# Patient Record
Sex: Female | Born: 1935 | Race: White | Hispanic: No | Marital: Married | Smoking: Former smoker
Health system: Southern US, Community
[De-identification: ages and names within clinical notes are randomized; demographics above are authoritative.]

## PROBLEM LIST (undated history)

## (undated) DIAGNOSIS — M1990 Unspecified osteoarthritis, unspecified site: Secondary | ICD-10-CM

## (undated) DIAGNOSIS — F419 Anxiety disorder, unspecified: Secondary | ICD-10-CM

## (undated) DIAGNOSIS — E079 Disorder of thyroid, unspecified: Secondary | ICD-10-CM

## (undated) DIAGNOSIS — E785 Hyperlipidemia, unspecified: Secondary | ICD-10-CM

## (undated) DIAGNOSIS — M8580 Other specified disorders of bone density and structure, unspecified site: Secondary | ICD-10-CM

## (undated) DIAGNOSIS — R51 Headache: Secondary | ICD-10-CM

## (undated) DIAGNOSIS — B029 Zoster without complications: Secondary | ICD-10-CM

## (undated) DIAGNOSIS — K219 Gastro-esophageal reflux disease without esophagitis: Secondary | ICD-10-CM

## (undated) HISTORY — DX: Headache: R51

## (undated) HISTORY — PX: COLONOSCOPY: SHX174

## (undated) HISTORY — DX: Disorder of thyroid, unspecified: E07.9

## (undated) HISTORY — DX: Gastro-esophageal reflux disease without esophagitis: K21.9

## (undated) HISTORY — DX: Hyperlipidemia, unspecified: E78.5

## (undated) HISTORY — DX: Anxiety disorder, unspecified: F41.9

## (undated) HISTORY — DX: Unspecified osteoarthritis, unspecified site: M19.90

## (undated) HISTORY — PX: BREAST SURGERY: SHX581

## (undated) HISTORY — DX: Zoster without complications: B02.9

## (undated) HISTORY — DX: Other specified disorders of bone density and structure, unspecified site: M85.80

---

## 1998-03-01 ENCOUNTER — Other Ambulatory Visit: Admission: RE | Admit: 1998-03-01 | Discharge: 1998-03-01 | Payer: Self-pay | Admitting: Family Medicine

## 1999-03-10 ENCOUNTER — Other Ambulatory Visit: Admission: RE | Admit: 1999-03-10 | Discharge: 1999-03-10 | Payer: Self-pay | Admitting: Family Medicine

## 1999-12-25 ENCOUNTER — Encounter: Admission: RE | Admit: 1999-12-25 | Discharge: 1999-12-25 | Payer: Self-pay | Admitting: Family Medicine

## 1999-12-25 ENCOUNTER — Encounter: Payer: Self-pay | Admitting: Family Medicine

## 2001-02-10 ENCOUNTER — Encounter: Admission: RE | Admit: 2001-02-10 | Discharge: 2001-02-10 | Payer: Self-pay | Admitting: Family Medicine

## 2001-02-10 ENCOUNTER — Encounter: Payer: Self-pay | Admitting: Family Medicine

## 2002-03-16 ENCOUNTER — Encounter: Payer: Self-pay | Admitting: Family Medicine

## 2002-03-16 ENCOUNTER — Encounter: Admission: RE | Admit: 2002-03-16 | Discharge: 2002-03-16 | Payer: Self-pay | Admitting: Family Medicine

## 2002-03-17 ENCOUNTER — Encounter: Admission: RE | Admit: 2002-03-17 | Discharge: 2002-03-17 | Payer: Self-pay | Admitting: Family Medicine

## 2002-03-17 ENCOUNTER — Encounter: Payer: Self-pay | Admitting: Family Medicine

## 2003-03-24 ENCOUNTER — Encounter: Payer: Self-pay | Admitting: Family Medicine

## 2003-03-24 ENCOUNTER — Encounter: Admission: RE | Admit: 2003-03-24 | Discharge: 2003-03-24 | Payer: Self-pay | Admitting: Family Medicine

## 2003-03-30 ENCOUNTER — Other Ambulatory Visit: Admission: RE | Admit: 2003-03-30 | Discharge: 2003-03-30 | Payer: Self-pay | Admitting: Family Medicine

## 2004-03-23 ENCOUNTER — Encounter: Admission: RE | Admit: 2004-03-23 | Discharge: 2004-03-23 | Payer: Self-pay | Admitting: Family Medicine

## 2004-05-18 ENCOUNTER — Ambulatory Visit: Payer: Self-pay | Admitting: Family Medicine

## 2004-09-19 ENCOUNTER — Ambulatory Visit: Payer: Self-pay | Admitting: Family Medicine

## 2004-12-06 ENCOUNTER — Ambulatory Visit: Payer: Self-pay | Admitting: Family Medicine

## 2005-03-27 ENCOUNTER — Encounter: Admission: RE | Admit: 2005-03-27 | Discharge: 2005-03-27 | Payer: Self-pay | Admitting: Family Medicine

## 2005-04-05 ENCOUNTER — Ambulatory Visit: Payer: Self-pay | Admitting: Family Medicine

## 2005-05-19 ENCOUNTER — Ambulatory Visit: Payer: Self-pay | Admitting: Family Medicine

## 2005-10-31 ENCOUNTER — Ambulatory Visit: Payer: Self-pay | Admitting: Family Medicine

## 2005-11-02 ENCOUNTER — Ambulatory Visit: Payer: Self-pay | Admitting: Family Medicine

## 2006-04-01 ENCOUNTER — Encounter: Admission: RE | Admit: 2006-04-01 | Discharge: 2006-04-01 | Payer: Self-pay | Admitting: Family Medicine

## 2006-04-18 ENCOUNTER — Other Ambulatory Visit: Admission: RE | Admit: 2006-04-18 | Discharge: 2006-04-18 | Payer: Self-pay | Admitting: Family Medicine

## 2006-04-18 ENCOUNTER — Ambulatory Visit: Payer: Self-pay | Admitting: Family Medicine

## 2006-04-18 ENCOUNTER — Encounter: Payer: Self-pay | Admitting: Family Medicine

## 2006-04-18 LAB — CONVERTED CEMR LAB
ALT: 24 units/L (ref 0–40)
AST: 26 units/L (ref 0–37)
Basophils Absolute: 0 10*3/uL (ref 0.0–0.1)
Basophils Relative: 0.6 % (ref 0.0–1.0)
Calcium: 10.3 mg/dL (ref 8.4–10.5)
Chol/HDL Ratio, serum: 4.3
Cholesterol: 189 mg/dL (ref 0–200)
Creatinine, Ser: 1.2 mg/dL (ref 0.4–1.2)
Eosinophil percent: 2 % (ref 0.0–5.0)
Glucose, Bld: 100 mg/dL — ABNORMAL HIGH (ref 70–99)
HDL: 44.1 mg/dL (ref >39.0)
Hemoglobin: 14.6 g/dL (ref 12.0–15.0)
LDL Cholesterol: 122 mg/dL — ABNORMAL HIGH (ref 0–99)
Lymphocytes Relative: 23.5 % (ref 12.0–46.0)
Monocytes Relative: 7.1 % (ref 3.0–11.0)
Neutrophils Relative %: 66.8 % (ref 43.0–77.0)
TSH: 0.9 microintl units/mL (ref 0.35–5.50)
Triglyceride fasting, serum: 112 mg/dL (ref 0–149)
VLDL: 22 mg/dL (ref 0–40)

## 2006-04-22 LAB — CONVERTED CEMR LAB: Pap Smear: NEGATIVE

## 2006-08-06 ENCOUNTER — Ambulatory Visit: Payer: Self-pay | Admitting: Family Medicine

## 2006-08-07 ENCOUNTER — Encounter: Payer: Self-pay | Admitting: Family Medicine

## 2006-08-22 ENCOUNTER — Ambulatory Visit: Payer: Self-pay | Admitting: Family Medicine

## 2007-03-03 DIAGNOSIS — E785 Hyperlipidemia, unspecified: Secondary | ICD-10-CM

## 2007-03-03 DIAGNOSIS — R51 Headache: Secondary | ICD-10-CM

## 2007-03-03 DIAGNOSIS — R519 Headache, unspecified: Secondary | ICD-10-CM | POA: Insufficient documentation

## 2007-04-17 ENCOUNTER — Encounter: Admission: RE | Admit: 2007-04-17 | Discharge: 2007-04-17 | Payer: Self-pay | Admitting: Family Medicine

## 2007-04-18 ENCOUNTER — Ambulatory Visit: Payer: Self-pay | Admitting: Family Medicine

## 2007-06-03 ENCOUNTER — Ambulatory Visit: Payer: Self-pay | Admitting: Family Medicine

## 2007-06-03 DIAGNOSIS — T50995A Adverse effect of other drugs, medicaments and biological substances, initial encounter: Secondary | ICD-10-CM

## 2007-06-03 DIAGNOSIS — M899 Disorder of bone, unspecified: Secondary | ICD-10-CM | POA: Insufficient documentation

## 2007-06-03 DIAGNOSIS — M949 Disorder of cartilage, unspecified: Secondary | ICD-10-CM

## 2007-06-03 DIAGNOSIS — N390 Urinary tract infection, site not specified: Secondary | ICD-10-CM | POA: Insufficient documentation

## 2007-06-03 DIAGNOSIS — E039 Hypothyroidism, unspecified: Secondary | ICD-10-CM | POA: Insufficient documentation

## 2007-06-03 DIAGNOSIS — M1990 Unspecified osteoarthritis, unspecified site: Secondary | ICD-10-CM | POA: Insufficient documentation

## 2007-06-03 LAB — CONVERTED CEMR LAB
Glucose, Urine, Semiquant: NEGATIVE
Nitrite: NEGATIVE
Specific Gravity, Urine: <1.005
Urobilinogen, UA: 0.2

## 2007-06-16 LAB — CONVERTED CEMR LAB
ALT: 20 units/L (ref 0–35)
Albumin: 4.1 g/dL (ref 3.5–5.2)
Alkaline Phosphatase: 86 units/L (ref 39–117)
BUN: 7 mg/dL (ref 6–23)
Basophils Relative: 0.1 % (ref 0.0–1.0)
Bilirubin, Direct: 0.1 mg/dL (ref 0.0–0.3)
CO2: 30 meq/L (ref 19–32)
Calcium: 10.3 mg/dL (ref 8.4–10.5)
Cholesterol: 154 mg/dL (ref 0–200)
Creatinine, Ser: 1.1 mg/dL (ref 0.4–1.2)
Eosinophils Absolute: 0.1 10*3/uL (ref 0.0–0.6)
GFR calc non Af Amer: 52 mL/min
Glucose, Bld: 101 mg/dL — ABNORMAL HIGH (ref 70–99)
HCT: 43.6 % (ref 36.0–46.0)
HDL: 40.7 mg/dL (ref >39.0)
LDL Cholesterol: 83 mg/dL (ref 0–99)
Lymphocytes Relative: 17.1 % (ref 12.0–46.0)
MCV: 85.8 fL (ref 78.0–100.0)
Monocytes Absolute: 0.6 10*3/uL (ref 0.2–0.7)
Monocytes Relative: 7.1 % (ref 3.0–11.0)
Neutrophils Relative %: 74.7 % (ref 43.0–77.0)
Platelets: 328 10*3/uL (ref 150–400)
Potassium: 4.8 meq/L (ref 3.5–5.1)
RBC: 5.08 M/uL (ref 3.87–5.11)
RDW: 12.2 % (ref 11.5–14.6)
TSH: 0.31 microintl units/mL — ABNORMAL LOW (ref 0.35–5.50)
Total CHOL/HDL Ratio: 3.8
Total Protein: 6.8 g/dL (ref 6.0–8.3)
Triglycerides: 151 mg/dL — ABNORMAL HIGH (ref 0–149)
VLDL: 30 mg/dL (ref 0–40)

## 2007-09-11 ENCOUNTER — Ambulatory Visit: Payer: Self-pay | Admitting: Family Medicine

## 2007-09-11 DIAGNOSIS — J069 Acute upper respiratory infection, unspecified: Secondary | ICD-10-CM | POA: Insufficient documentation

## 2007-09-11 DIAGNOSIS — J209 Acute bronchitis, unspecified: Secondary | ICD-10-CM

## 2008-01-13 ENCOUNTER — Ambulatory Visit: Payer: Self-pay | Admitting: Family Medicine

## 2008-01-13 DIAGNOSIS — R5381 Other malaise: Secondary | ICD-10-CM

## 2008-01-13 DIAGNOSIS — K625 Hemorrhage of anus and rectum: Secondary | ICD-10-CM

## 2008-01-13 DIAGNOSIS — R5383 Other fatigue: Secondary | ICD-10-CM

## 2008-01-13 DIAGNOSIS — M545 Low back pain: Secondary | ICD-10-CM

## 2008-01-14 ENCOUNTER — Ambulatory Visit: Payer: Self-pay | Admitting: Family Medicine

## 2008-04-14 ENCOUNTER — Ambulatory Visit: Payer: Self-pay | Admitting: Family Medicine

## 2008-04-19 ENCOUNTER — Encounter: Admission: RE | Admit: 2008-04-19 | Discharge: 2008-04-19 | Payer: Self-pay | Admitting: Family Medicine

## 2008-04-28 ENCOUNTER — Encounter: Admission: RE | Admit: 2008-04-28 | Discharge: 2008-04-28 | Payer: Self-pay | Admitting: Family Medicine

## 2008-06-10 ENCOUNTER — Encounter: Payer: Self-pay | Admitting: Family Medicine

## 2008-06-10 ENCOUNTER — Ambulatory Visit: Payer: Self-pay | Admitting: Family Medicine

## 2008-06-10 ENCOUNTER — Other Ambulatory Visit: Admission: RE | Admit: 2008-06-10 | Discharge: 2008-06-10 | Payer: Self-pay | Admitting: Family Medicine

## 2008-06-10 DIAGNOSIS — F411 Generalized anxiety disorder: Secondary | ICD-10-CM | POA: Insufficient documentation

## 2008-06-10 DIAGNOSIS — J45909 Unspecified asthma, uncomplicated: Secondary | ICD-10-CM | POA: Insufficient documentation

## 2008-06-10 DIAGNOSIS — M722 Plantar fascial fibromatosis: Secondary | ICD-10-CM

## 2008-06-10 LAB — HM PAP SMEAR

## 2008-06-16 ENCOUNTER — Encounter: Payer: Self-pay | Admitting: Family Medicine

## 2008-07-09 HISTORY — PX: CATARACT EXTRACTION, BILATERAL: SHX1313

## 2008-12-29 ENCOUNTER — Ambulatory Visit: Payer: Self-pay | Admitting: Family Medicine

## 2008-12-29 ENCOUNTER — Telehealth: Payer: Self-pay | Admitting: Family Medicine

## 2008-12-29 DIAGNOSIS — R609 Edema, unspecified: Secondary | ICD-10-CM

## 2009-04-12 ENCOUNTER — Ambulatory Visit: Payer: Self-pay | Admitting: Family Medicine

## 2009-05-23 ENCOUNTER — Encounter: Admission: RE | Admit: 2009-05-23 | Discharge: 2009-05-23 | Payer: Self-pay | Admitting: Family Medicine

## 2009-06-14 IMAGING — CR DG KNEE 1-2V*R*
2 series · 2 of 2 positions shown · non-contrast
Comparison: None

CLINICAL DATA: Arthritis.  Right knee pain.

RIGHT KNEE - 1-2 VIEW

[view not recorded (1 of 2)]
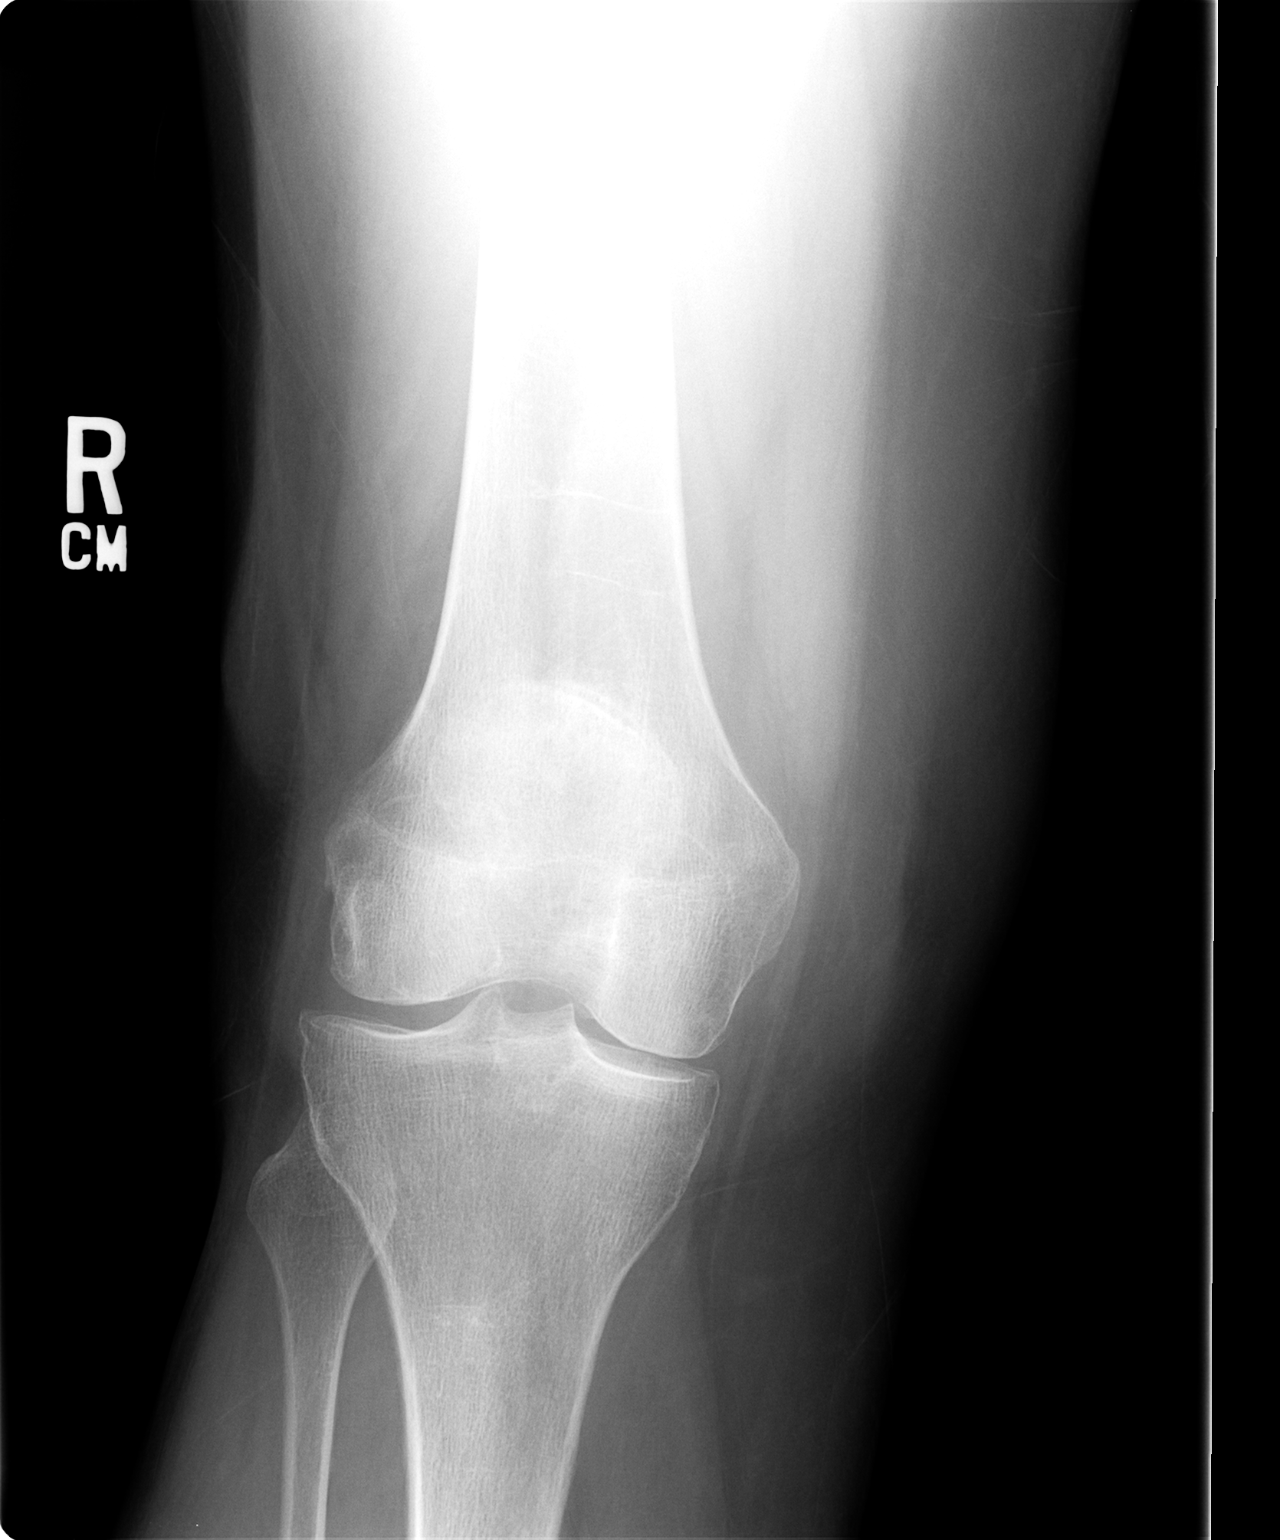

[view not recorded (2 of 2)]
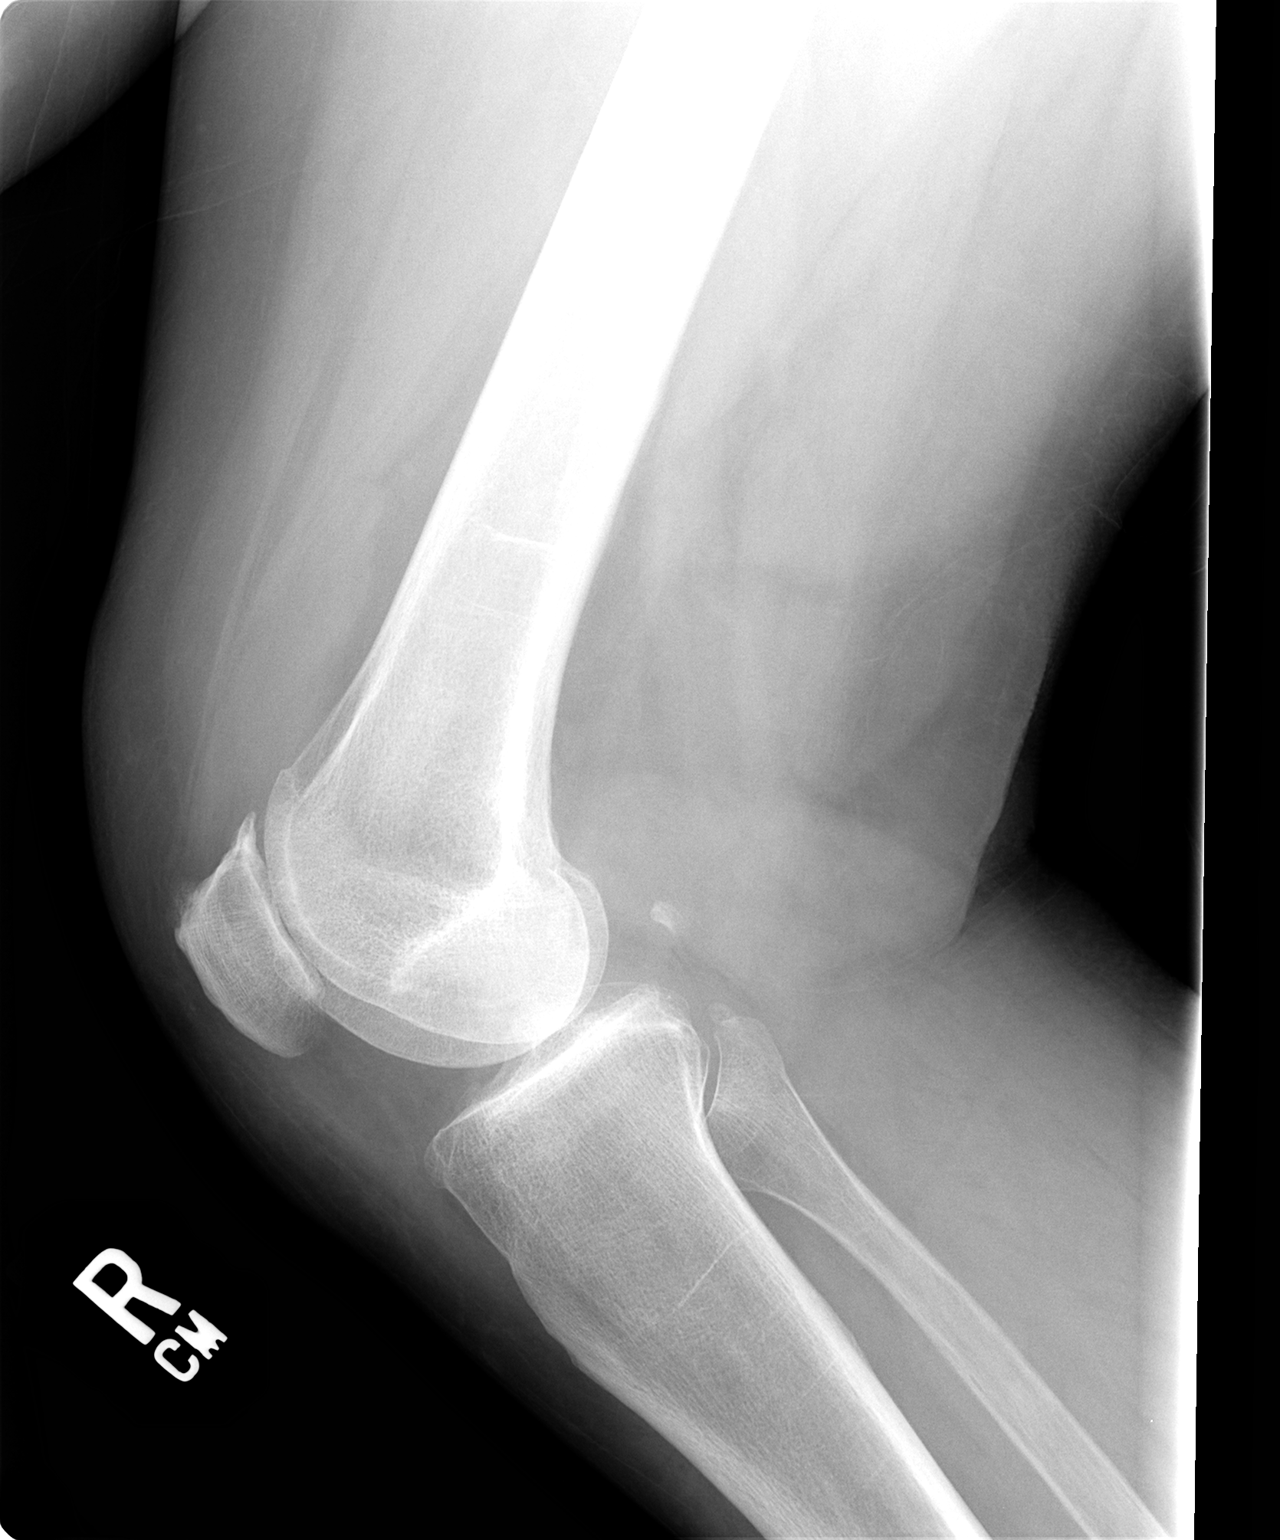

[2 of 2 positions shown; findings below may reference images not displayed]

FINDINGS: Narrowing of the patellofemoral joint space.  Osteophytic
lipping of the dorsal and superior aspect of the patella.
Femorotibial joint spaces are preserved.  Slight osteophytic
lipping of the lateral tibial plateau.  No lytic lesions or para-
articular erosions.  Knee joint effusion.  There appears to be a
large amount of fluid in the suprapatellar bursa.
IMPRESSION: Degenerative OA changes mainly involving the patellofemoral joint.
Joint effusion.

## 2009-06-16 ENCOUNTER — Ambulatory Visit: Payer: Self-pay | Admitting: Family Medicine

## 2010-03-31 ENCOUNTER — Encounter: Payer: Self-pay | Admitting: Family Medicine

## 2010-04-03 ENCOUNTER — Ambulatory Visit: Payer: Self-pay | Admitting: Family Medicine

## 2010-04-03 DIAGNOSIS — B351 Tinea unguium: Secondary | ICD-10-CM

## 2010-04-03 DIAGNOSIS — M25569 Pain in unspecified knee: Secondary | ICD-10-CM

## 2010-04-03 LAB — CONVERTED CEMR LAB
Basophils Absolute: 0.1 10*3/uL (ref 0.0–0.1)
CO2: 32 meq/L (ref 19–32)
Calcium: 9.9 mg/dL (ref 8.4–10.5)
Creatinine, Ser: 1.2 mg/dL (ref 0.4–1.2)
Eosinophils Absolute: 0.3 10*3/uL (ref 0.0–0.7)
Glucose, Bld: 98 mg/dL (ref 70–99)
Hemoglobin: 14.2 g/dL (ref 12.0–15.0)
Lymphocytes Relative: 29.6 % (ref 12.0–46.0)
Lymphs Abs: 2.9 10*3/uL (ref 0.7–4.0)
MCHC: 33.2 g/dL (ref 30.0–36.0)
Neutrophils Relative %: 57 % (ref 43.0–77.0)
Platelets: 320 10*3/uL (ref 150.0–400.0)
Potassium: 5 meq/L (ref 3.5–5.1)
RBC: 4.96 M/uL (ref 3.87–5.11)
TSH: 0.47 microintl units/mL (ref 0.35–5.50)
Uric Acid, Serum: 7.6 mg/dL — ABNORMAL HIGH (ref 2.4–7.0)
WBC: 9.7 10*3/uL (ref 4.5–10.5)

## 2010-06-15 ENCOUNTER — Encounter
Admission: RE | Admit: 2010-06-15 | Discharge: 2010-06-15 | Payer: Self-pay | Source: Home / Self Care | Admitting: Family Medicine

## 2010-07-30 ENCOUNTER — Encounter: Payer: Self-pay | Admitting: Family Medicine

## 2010-08-06 LAB — CONVERTED CEMR LAB
ALT: 23 units/L (ref 0–35)
ALT: 23 units/L (ref 0–35)
AST: 24 units/L (ref 0–37)
AST: 27 units/L (ref 0–37)
Alkaline Phosphatase: 78 units/L (ref 39–117)
Alkaline Phosphatase: 91 units/L (ref 39–117)
Blood in Urine, dipstick: NEGATIVE
Blood in Urine, dipstick: NEGATIVE
CO2: 26 meq/L (ref 19–32)
CO2: 32 meq/L (ref 19–32)
Calcium: 10 mg/dL (ref 8.4–10.5)
Calcium: 9.9 mg/dL (ref 8.4–10.5)
Chloride: 107 meq/L (ref 96–112)
Chloride: 108 meq/L (ref 96–112)
Cholesterol: 152 mg/dL (ref 0–200)
Eosinophils Absolute: 0.1 10*3/uL (ref 0.0–0.7)
GFR calc non Af Amer: 51.45 mL/min (ref >60)
GFR calc non Af Amer: 58 mL/min
Glucose, Bld: 106 mg/dL — ABNORMAL HIGH (ref 70–99)
Glucose, Urine, Semiquant: NEGATIVE
HCT: 44.1 % (ref 36.0–46.0)
HCT: 45 % (ref 36.0–46.0)
HDL: 36 mg/dL — ABNORMAL LOW (ref >39.00)
Hemoglobin: 14.7 g/dL (ref 12.0–15.0)
Hemoglobin: 15.3 g/dL — ABNORMAL HIGH (ref 12.0–15.0)
Ketones, urine, test strip: NEGATIVE
Lymphocytes Relative: 22.1 % (ref 12.0–46.0)
MCHC: 33.9 g/dL (ref 30.0–36.0)
Monocytes Absolute: 0.7 10*3/uL (ref 0.1–1.0)
Monocytes Relative: 5.8 % (ref 3.0–12.0)
Neutro Abs: 5.6 10*3/uL (ref 1.4–7.7)
Neutro Abs: 6.2 10*3/uL (ref 1.4–7.7)
Neutrophils Relative %: 67.9 % (ref 43.0–77.0)
Nitrite: NEGATIVE
Nitrite: NEGATIVE
Potassium: 3.9 meq/L (ref 3.5–5.1)
Potassium: 4.1 meq/L (ref 3.5–5.1)
RBC: 5.03 M/uL (ref 3.87–5.11)
RDW: 12.2 % (ref 11.5–14.6)
Sodium: 148 meq/L — ABNORMAL HIGH (ref 135–145)
Specific Gravity, Urine: <1.005
TSH: 0.37 microintl units/mL (ref 0.35–5.50)
Total Bilirubin: 0.7 mg/dL (ref 0.3–1.2)
Total CHOL/HDL Ratio: 4
Urobilinogen, UA: 0.2
WBC: 9.1 10*3/uL (ref 4.5–10.5)
pH: 5

## 2010-08-10 NOTE — Miscellaneous (Signed)
Summary: Controlled Substances Contract  Controlled Substances Contract   Imported By: Maryln Gottron 04/04/2010 15:36:36  _____________________________________________________________________  External Attachment:    Type:   Image     Comment:   External Document
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
## 2010-09-20 ENCOUNTER — Other Ambulatory Visit: Payer: Self-pay | Admitting: Family Medicine

## 2010-12-25 ENCOUNTER — Other Ambulatory Visit: Payer: Self-pay | Admitting: Family Medicine

## 2010-12-26 ENCOUNTER — Other Ambulatory Visit: Payer: Self-pay

## 2010-12-26 MED ORDER — LEVOTHYROXINE SODIUM 88 MCG PO TABS: 88.0000 ug | ORAL_TABLET | Freq: Every day | ORAL | Status: DC

## 2010-12-26 NOTE — Telephone Encounter (Signed)
rx sent into pharmacy

## 2011-01-30 ENCOUNTER — Encounter: Payer: Self-pay | Admitting: Family Medicine

## 2011-01-31 ENCOUNTER — Ambulatory Visit (INDEPENDENT_AMBULATORY_CARE_PROVIDER_SITE_OTHER): Payer: Medicare Other | Admitting: Family Medicine

## 2011-01-31 ENCOUNTER — Encounter: Payer: Self-pay | Admitting: Family Medicine

## 2011-01-31 VITALS — BP 110/80 | HR 91 | Temp 98.0°F | Wt 188.0 lb

## 2011-01-31 DIAGNOSIS — E785 Hyperlipidemia, unspecified: Secondary | ICD-10-CM

## 2011-01-31 DIAGNOSIS — E559 Vitamin D deficiency, unspecified: Secondary | ICD-10-CM

## 2011-01-31 DIAGNOSIS — R3915 Urgency of urination: Secondary | ICD-10-CM

## 2011-01-31 DIAGNOSIS — E039 Hypothyroidism, unspecified: Secondary | ICD-10-CM

## 2011-01-31 DIAGNOSIS — IMO0002 Reserved for concepts with insufficient information to code with codable children: Secondary | ICD-10-CM

## 2011-01-31 DIAGNOSIS — S336XXA Sprain of sacroiliac joint, initial encounter: Secondary | ICD-10-CM

## 2011-01-31 DIAGNOSIS — D649 Anemia, unspecified: Secondary | ICD-10-CM

## 2011-01-31 DIAGNOSIS — M129 Arthropathy, unspecified: Secondary | ICD-10-CM

## 2011-01-31 DIAGNOSIS — M1990 Unspecified osteoarthritis, unspecified site: Secondary | ICD-10-CM

## 2011-01-31 LAB — CBC WITH DIFFERENTIAL/PLATELET
Basophils Relative: 0.6 % (ref 0.0–3.0)
Eosinophils Absolute: 0.3 10*3/uL (ref 0.0–0.7)
Lymphocytes Relative: 28.7 % (ref 12.0–46.0)
MCHC: 33.5 g/dL (ref 30.0–36.0)
Monocytes Absolute: 0.7 10*3/uL (ref 0.1–1.0)
Neutrophils Relative %: 61.5 % (ref 43.0–77.0)
Platelets: 331 10*3/uL (ref 150.0–400.0)
RBC: 5.15 Mil/uL — ABNORMAL HIGH (ref 3.87–5.11)
WBC: 10.1 10*3/uL (ref 4.5–10.5)

## 2011-01-31 LAB — LIPID PANEL
HDL: 39.6 mg/dL (ref >39.00)
Triglycerides: 384 mg/dL — ABNORMAL HIGH (ref 0.0–149.0)
VLDL: 76.8 mg/dL — ABNORMAL HIGH (ref 0.0–40.0)

## 2011-01-31 LAB — HEPATIC FUNCTION PANEL
ALT: 21 U/L (ref 0–35)
AST: 24 U/L (ref 0–37)
Alkaline Phosphatase: 85 U/L (ref 39–117)
Bilirubin, Direct: 0 mg/dL (ref 0.0–0.3)
Total Bilirubin: 0.4 mg/dL (ref 0.3–1.2)

## 2011-01-31 LAB — BASIC METABOLIC PANEL
BUN: 14 mg/dL (ref 6–23)
Creatinine, Ser: 1.1 mg/dL (ref 0.4–1.2)
GFR: 51.77 mL/min — ABNORMAL LOW (ref >60.00)
Glucose, Bld: 100 mg/dL — ABNORMAL HIGH (ref 70–99)
Potassium: 4.5 mEq/L (ref 3.5–5.1)
Sodium: 143 mEq/L (ref 135–145)

## 2011-01-31 LAB — POCT URINALYSIS DIPSTICK
Bilirubin, UA: NEGATIVE
Ketones, UA: NEGATIVE
Protein, UA: NEGATIVE
Spec Grav, UA: 1.005
pH, UA: 5

## 2011-01-31 LAB — LDL CHOLESTEROL, DIRECT: Direct LDL: 63.7 mg/dL

## 2011-01-31 MED ORDER — DICLOFENAC SODIUM 75 MG PO TBEC: 75.0000 mg | DELAYED_RELEASE_TABLET | Freq: Two times a day (BID) | ORAL | Status: DC

## 2011-01-31 MED ORDER — DIAZEPAM 10 MG PO TABS: 10.0000 mg | ORAL_TABLET | Freq: Three times a day (TID) | ORAL | Status: DC | PRN

## 2011-01-31 MED ORDER — LEVOTHYROXINE SODIUM 88 MCG PO TABS: 88.0000 ug | ORAL_TABLET | Freq: Every day | ORAL | Status: DC

## 2011-01-31 MED ORDER — ATORVASTATIN CALCIUM 40 MG PO TABS: 40.0000 mg | ORAL_TABLET | Freq: Every day | ORAL | Status: DC

## 2011-01-31 MED ORDER — HYDROCODONE-ACETAMINOPHEN 7.5-500 MG PO TABS: 1.0000 | ORAL_TABLET | Freq: Four times a day (QID) | ORAL | Status: DC | PRN

## 2011-01-31 MED ORDER — TRIAMTERENE-HCTZ 75-50 MG PO TABS: 1.0000 | ORAL_TABLET | Freq: Every day | ORAL | Status: DC

## 2011-02-01 LAB — VITAMIN D 25 HYDROXY (VIT D DEFICIENCY, FRACTURES): Vit D, 25-Hydroxy: 43 ng/mL (ref 30–89)

## 2011-02-02 ENCOUNTER — Other Ambulatory Visit: Payer: Self-pay

## 2011-02-02 MED ORDER — PANTOPRAZOLE SODIUM 40 MG PO TBEC: 40.0000 mg | DELAYED_RELEASE_TABLET | Freq: Every day | ORAL | Status: DC

## 2011-02-02 MED ORDER — LEVOTHYROXINE SODIUM 75 MCG PO TABS: 75.0000 ug | ORAL_TABLET | Freq: Every day | ORAL | Status: DC

## 2011-02-05 NOTE — Progress Notes (Signed)
Pt aware.

## 2011-03-05 ENCOUNTER — Encounter: Payer: Self-pay | Admitting: Family Medicine

## 2011-03-05 NOTE — Patient Instructions (Addendum)
Will call results of lab studies  Refilled your medications Continue diclofenac 75 mg twice a day as well as hydrocodone as needed Continue other medications as prescribed

## 2011-03-05 NOTE — Progress Notes (Signed)
  Subjective:    Patient ID: Denise Petersen, female    DOB: 19-Feb-1932, 77 y.o.   MRN: [id number] This 77 year old white married female is in today for a med check as well as get lab test for thyroid and hyperlipidemia Continues to have back pain with some radiation down her right leg also continues to have pain in the right knee. Has no longer had problem with plantar fasciitis Continues to need hydrocodone for back pain So continues to take Valium  for stress and at night for sleep Relates her mammogram was normal  HPI    Review of Systems History of is no    Objective:   Physical Exam the patient is a well-built well-nourished obese white female in no distress Heart lungs no abnormalities noted Examination of the spine reveals tenderness over the right SI joint no limitation straight leg raising Right knee swollen tender medially and laterally        Assessment & Plan:  Sacroiliac arthritis or strain restart diclofenac 75 mg twice a day and take hydrocodone as needed Hyperlipidemia continue Lipitor Hypothyroidism continue levothyroxine Continue to recommend weight loss Hypertension blood pressure well controlled

## 2011-04-23 ENCOUNTER — Other Ambulatory Visit: Payer: Self-pay | Admitting: Family Medicine

## 2011-05-01 ENCOUNTER — Ambulatory Visit (INDEPENDENT_AMBULATORY_CARE_PROVIDER_SITE_OTHER): Payer: Medicare Other

## 2011-05-01 DIAGNOSIS — Z23 Encounter for immunization: Secondary | ICD-10-CM

## 2011-06-06 ENCOUNTER — Other Ambulatory Visit: Payer: Self-pay | Admitting: Family Medicine

## 2011-06-06 DIAGNOSIS — Z1231 Encounter for screening mammogram for malignant neoplasm of breast: Secondary | ICD-10-CM

## 2011-07-05 ENCOUNTER — Ambulatory Visit
Admission: RE | Admit: 2011-07-05 | Discharge: 2011-07-05 | Disposition: A | Discharge status: Home / Self Care | Payer: BC Managed Care – PPO | Source: Ambulatory Visit | Attending: Family Medicine | Admitting: Family Medicine

## 2011-07-05 DIAGNOSIS — Z1231 Encounter for screening mammogram for malignant neoplasm of breast: Secondary | ICD-10-CM

## 2011-07-18 ENCOUNTER — Encounter: Payer: Self-pay | Admitting: Family Medicine

## 2011-07-18 ENCOUNTER — Ambulatory Visit (INDEPENDENT_AMBULATORY_CARE_PROVIDER_SITE_OTHER): Payer: Medicare Other | Admitting: Family Medicine

## 2011-07-18 VITALS — BP 116/76 | HR 94 | Temp 98.3°F | Wt 194.0 lb

## 2011-07-18 DIAGNOSIS — M129 Arthropathy, unspecified: Secondary | ICD-10-CM

## 2011-07-18 DIAGNOSIS — K219 Gastro-esophageal reflux disease without esophagitis: Secondary | ICD-10-CM

## 2011-07-18 DIAGNOSIS — E785 Hyperlipidemia, unspecified: Secondary | ICD-10-CM

## 2011-07-18 DIAGNOSIS — M1990 Unspecified osteoarthritis, unspecified site: Secondary | ICD-10-CM

## 2011-07-18 DIAGNOSIS — E039 Hypothyroidism, unspecified: Secondary | ICD-10-CM

## 2011-07-18 MED ORDER — HYDROCODONE-ACETAMINOPHEN 7.5-500 MG PO TABS: 1.0000 | ORAL_TABLET | Freq: Four times a day (QID) | ORAL | Status: DC | PRN

## 2011-07-18 NOTE — Progress Notes (Signed)
  Subjective:    Patient ID: Denise Petersen, female    DOB: June 21, 1932, 78 y.o.   MRN: [id number]  HPI 78 yr old female to establish with me after the retirement of Dr. Scotty Court. She is doing well in general except for arthritic pains. No SOB or chest pains. She has had her flu shot and mammogram this year.    Review of Systems  Constitutional: Negative.   Respiratory: Negative.   Cardiovascular: Negative.   Gastrointestinal: Negative.   Genitourinary: Negative.   Musculoskeletal: Positive for arthralgias.  Neurological: Negative.        Objective:   Physical Exam  Constitutional: She appears well-developed and well-nourished.  Neck: Neck supple. No thyromegaly present.  Cardiovascular: Normal rate, regular rhythm, normal heart sounds and intact distal pulses.   Pulmonary/Chest: Effort normal and breath sounds normal.  Lymphadenopathy:    She has no cervical adenopathy.          Assessment & Plan:  Her problems all seem to be stable. meds were refilled. Get a cpx this summer

## 2011-10-20 ENCOUNTER — Other Ambulatory Visit: Payer: Self-pay | Admitting: Family Medicine

## 2011-10-25 NOTE — Telephone Encounter (Signed)
Call in #[phone] with 5 rf 

## 2011-11-01 ENCOUNTER — Encounter: Payer: Self-pay | Admitting: Family Medicine

## 2011-11-01 ENCOUNTER — Ambulatory Visit (INDEPENDENT_AMBULATORY_CARE_PROVIDER_SITE_OTHER): Payer: Medicare Other | Admitting: Family Medicine

## 2011-11-01 VITALS — BP 132/82 | HR 80 | Temp 98.6°F | Resp 14

## 2011-11-01 DIAGNOSIS — B029 Zoster without complications: Secondary | ICD-10-CM

## 2011-11-01 MED ORDER — VALACYCLOVIR HCL 1 G PO TABS: 1000.0000 mg | ORAL_TABLET | Freq: Three times a day (TID) | ORAL | Status: DC

## 2011-11-01 MED ORDER — PREDNISONE (PAK) 10 MG PO TABS: ORAL_TABLET | ORAL | Status: DC

## 2011-11-01 NOTE — Progress Notes (Signed)
  Subjective:    Patient ID: Denise Petersen, female    DOB: 1932/06/29, 78 y.o.   MRN: [id number]  HPI Here for 3 days of burning and pain in the right forehead with a rash. This reminds her of the shingles she had many years ago. No eye pain or visual problems.    Review of Systems  Constitutional: Negative.   HENT: Negative.   Respiratory: Negative.   Cardiovascular: Negative.   Skin: Positive for rash.       Objective:   Physical Exam  Constitutional: She appears well-developed and well-nourished.  HENT:  Head: Normocephalic and atraumatic.  Right Ear: External ear normal.  Left Ear: External ear normal.  Nose: Nose normal.  Mouth/Throat: Oropharynx is clear and moist. No oropharyngeal exudate.  Eyes: Conjunctivae are normal. Pupils are equal, round, and reactive to light.  Neck: Neck supple. No thyromegaly present.  Lymphadenopathy:    She has no cervical adenopathy.  Skin:       Patches od red vesicles on the right forehead and along the scalp line           Assessment & Plan:  Treat with steroids and Valtrex

## 2011-12-24 ENCOUNTER — Encounter: Payer: Self-pay | Admitting: Family Medicine

## 2011-12-24 ENCOUNTER — Ambulatory Visit (INDEPENDENT_AMBULATORY_CARE_PROVIDER_SITE_OTHER): Payer: Medicare Other | Admitting: Family Medicine

## 2011-12-24 VITALS — BP 120/70 | HR 106 | Temp 98.9°F | Wt 196.0 lb

## 2011-12-24 DIAGNOSIS — B029 Zoster without complications: Secondary | ICD-10-CM

## 2011-12-24 MED ORDER — VALACYCLOVIR HCL 1 G PO TABS: 1000.0000 mg | ORAL_TABLET | Freq: Two times a day (BID) | ORAL | Status: DC

## 2011-12-24 MED ORDER — MUPIROCIN CALCIUM 2 % EX CREA: TOPICAL_CREAM | Freq: Three times a day (TID) | CUTANEOUS | Status: AC

## 2011-12-24 NOTE — Progress Notes (Signed)
  Subjective:    Patient ID: Denise Petersen, female    DOB: 26-Jan-1932, 78 y.o.   MRN: [id number]  HPI Here for a recurrence of shingles on the right forehead. We saw her for this in April, and she took a course of Prednisone and Valtrex. The rash went away but it came back 4 days ago. .   Review of Systems  Constitutional: Negative.   Eyes: Negative.   Skin: Positive for rash.       Objective:   Physical Exam  Constitutional: She appears well-developed and well-nourished.  HENT:  Right Ear: External ear normal.  Left Ear: External ear normal.  Nose: Nose normal.  Mouth/Throat: Oropharynx is clear and moist.  Eyes: Conjunctivae and EOM are normal. Pupils are equal, round, and reactive to light.  Neck: Neck supple. No thyromegaly present.  Lymphadenopathy:    She has no cervical adenopathy.  Skin:       The right forehead has 3 clusters of red vesicles at the hairline           Assessment & Plan:  Shingles recurrence. Treat with Valtrex for 30 days, also Bactroban prn

## 2012-01-03 ENCOUNTER — Encounter (HOSPITAL_COMMUNITY): Payer: Self-pay

## 2012-01-03 ENCOUNTER — Telehealth: Payer: Self-pay | Admitting: Family Medicine

## 2012-01-03 ENCOUNTER — Emergency Department (INDEPENDENT_AMBULATORY_CARE_PROVIDER_SITE_OTHER)
Admission: EM | Admit: 2012-01-03 | Discharge: 2012-01-03 | Disposition: A | Discharge status: Home Health | Payer: Medicare Other | Source: Home / Self Care | Attending: Emergency Medicine | Admitting: Emergency Medicine

## 2012-01-03 DIAGNOSIS — B029 Zoster without complications: Secondary | ICD-10-CM

## 2012-01-03 DIAGNOSIS — H00019 Hordeolum externum unspecified eye, unspecified eyelid: Secondary | ICD-10-CM

## 2012-01-03 MED ORDER — ERYTHROMYCIN 5 MG/GM OP OINT: TOPICAL_OINTMENT | OPHTHALMIC | Status: AC

## 2012-01-03 MED ORDER — AZITHROMYCIN 250 MG PO TABS: ORAL_TABLET | ORAL | Status: AC

## 2012-01-03 NOTE — ED Notes (Signed)
Pt c/o R eye pain.  Pt currently being TX for shingles by Dr. Abran Cantor. Pt using oral and topical meds.  Pt had eye itching yesterday and swelling and pain today.  Pt attempted to see PCP today but unable to get appt and was told to come here for evaluation.

## 2012-01-03 NOTE — Telephone Encounter (Signed)
Caller: Atziri/Patient; PCP: Nelwyn Salisbury.; CB#: [phone];  Call regarding Being Treated For Shingles on forehead- onset 12/24/11.  R Eye starting itchying on 01/02/12 and This Morning Woke Up With Swelling Under Eye that has gotton Worse Throughout the Day and NOW EYE IS SORE; She feels like she has pocket of fluid under eye-01/03/12. Was taking Prednisone for Original rash on forhead but fininished taking and is still using cream TID and Acyclovir.  Original rash is drying up.  Afebrile. Denies blurry vision. Triage per Skin Lesions and Eye Infection Protocols and advised to be seen in [facility] within 4 hours since office is closing and she lives 30 min away. She is requesting appnt for 01/03/12. Stongly advised that she not wait until tomorrow to be checked by Medical Doctor within 4 hours. Transered to RN in the office to speak with her. She agreed to go to [facility] Only if sx worsened and will call in AM on 01/04/12 for appnt.

## 2012-01-03 NOTE — Discharge Instructions (Signed)
Sty  A sty (hordeolum) is an infection of a gland in the eyelid located at the base of the eyelash. A sty may develop a white or yellow head of pus. It can be puffy (swollen). Usually, the sty will burst and pus will come out on its own. They do not leave lumps in the eyelid once they drain.  A sty is often confused with another form of cyst of the eyelid called a chalazion. Chalazions occur within the eyelid and not on the edge where the bases of the eyelashes are. They often are red, sore and then form firm lumps in the eyelid.  CAUSES    Germs (bacteria).   Lasting (chronic) eyelid inflammation.  SYMPTOMS    Tenderness, redness and swelling along the edge of the eyelid at the base of the eyelashes.   Sometimes, there is a white or yellow head of pus. It may or may not drain.  DIAGNOSIS   An ophthalmologist will be able to distinguish between a sty and a chalazion and treat the condition appropriately.   TREATMENT    Styes are typically treated with warm packs (compresses) until drainage occurs.   In rare cases, medicines that kill germs (antibiotics) may be prescribed. These antibiotics may be in the form of drops, cream or pills.   If a hard lump has formed, it is generally necessary to do a small incision and remove the hardened contents of the cyst in a minor surgical procedure done in the office.   In suspicious cases, your caregiver may send the contents of the cyst to the lab to be certain that it is not a rare, but dangerous form of cancer of the glands of the eyelid.  HOME CARE INSTRUCTIONS    Wash your hands often and dry them with a clean towel. Avoid touching your eyelid. This may spread the infection to other parts of the eye.   Apply heat to your eyelid for 10 to 20 minutes, several times a day, to ease pain and help to heal it faster.   Do not squeeze the sty. Allow it to drain on its own. Wash your eyelid carefully 3 to 4 times per day to remove any pus.  SEEK IMMEDIATE MEDICAL CARE IF:     Your eye becomes painful or puffy (swollen).   Your vision changes.   Your sty does not drain by itself within 3 days.   Your sty comes back within a short period of time, even with treatment.   You have redness (inflammation) around the eye.   You have a fever.  Document Released: 04/04/2005 Document Revised: 06/14/2011 Document Reviewed: 12/07/2008  [employer] Patient Information 2012 [employer]

## 2012-01-03 NOTE — ED Provider Notes (Signed)
Chief Complaint  Patient presents with  . Eye Pain    History of Present Illness:   Denise Petersen is a 78 year old female who has a two-day history of pain, swelling, itching, and tenderness of her right lower eyelid. There is no redness of the eye, no blurring of her vision, and no discharge from her eye. She had shingles 2 or 3 weeks ago on her right forehead. She was seen by her primary care physician, Dr. fly to prescribe prednisone, Valtrex, and a cream. The shingles seem to be healing up well. She denies any fever or chills.  Review of Systems:  Other than noted above, the patient denies any of the following symptoms: Systemic:  No fever, chills, sweats, fatigue, or weight loss. Eye:  No redness, eye pain, photophobia, discharge, blurred vision, or diplopia. ENT:  No nasal congestion, rhinorrhea, or sore throat. Lymphatic:  No adenopathy. Skin:  No rash or pruritis.  PMFSH:  Past medical history, family history, social history, meds, and allergies were reviewed.  Physical Exam:   Vital signs:  BP 141/84  Pulse 90  Temp 99.5 F (37.5 C) (Oral)  Resp 20  SpO2 95% General:  Alert and in no distress. Eye:  Her lower lid is swollen, tender, and red. There is a nodule within the lower lid consistent with an hordeolum. There was no discharge in the eye itself, no redness of the globe, cornea appeared intact, and anterior chamber was normal. Full EOMs, PERRLA, fundi benign. ENT:  TMs and canals clear.  Nasal mucosa normal.  No intra-oral lesions, mucous membranes moist, pharynx clear. Neck:  No adenopathy tenderness or mass. Skin:  She has a healing patchy shingles on her right forehead that did not appear to be infected.  Assessment:  The primary encounter diagnosis was Hordeolum. A diagnosis of Shingles was also pertinent to this visit.  Plan:   1.  The following meds were prescribed:   New Prescriptions   AZITHROMYCIN (ZITHROMAX Z-PAK) 250 MG TABLET    Take as directed.   ERYTHROMYCIN  OPHTHALMIC OINTMENT    Apply 1/2 inch ribbon to right eye QID   2.  The patient was instructed in symptomatic care and handouts were given. 3.  The patient was told to return if becoming worse in any way, if no better in 3 or 4 days, and given some red flag symptoms that would indicate earlier return.  Follow up:  The patient was told to follow up with Dr. Liborio Nixon tomorrow at 10 AM. Dr. Gwen Pounds was called and I described the case to him. He agrees that this is most likely an hordeolum and is not related to her shingles, but he wanted to check her nevertheless.      Reuben Likes, MD 01/03/12 [id number]

## 2012-01-04 ENCOUNTER — Ambulatory Visit: Payer: Medicare Other | Admitting: Family Medicine

## 2012-01-04 NOTE — Telephone Encounter (Signed)
I need to see her ASAP. Have her come in as early today as she can. I will work her in when she gets here

## 2012-01-04 NOTE — Telephone Encounter (Signed)
Dr. Fry is aware.  

## 2012-01-04 NOTE — Telephone Encounter (Signed)
Can you call pt and schedule a office visit, please see below note.

## 2012-01-04 NOTE — Telephone Encounter (Signed)
[facility] to [facility] and they called an opthlomologist and scheduled her for this morning.

## 2012-01-16 ENCOUNTER — Other Ambulatory Visit: Payer: Self-pay | Admitting: Family Medicine

## 2012-01-16 NOTE — Telephone Encounter (Signed)
Call in #[phone] with 5 rf 

## 2012-02-21 ENCOUNTER — Other Ambulatory Visit: Payer: Self-pay | Admitting: Family Medicine

## 2012-03-17 ENCOUNTER — Encounter: Payer: Self-pay | Admitting: Family Medicine

## 2012-03-17 ENCOUNTER — Ambulatory Visit (INDEPENDENT_AMBULATORY_CARE_PROVIDER_SITE_OTHER): Payer: Medicare Other | Admitting: Family Medicine

## 2012-03-17 ENCOUNTER — Telehealth: Payer: Self-pay | Admitting: Family Medicine

## 2012-03-17 VITALS — BP 130/80 | HR 99 | Temp 98.4°F | Wt 199.0 lb

## 2012-03-17 DIAGNOSIS — R079 Chest pain, unspecified: Secondary | ICD-10-CM

## 2012-03-17 DIAGNOSIS — M940 Chondrocostal junction syndrome [Tietze]: Secondary | ICD-10-CM

## 2012-03-17 MED ORDER — DICLOFENAC SODIUM 75 MG PO TBEC: 75.0000 mg | DELAYED_RELEASE_TABLET | Freq: Two times a day (BID) | ORAL | Status: DC

## 2012-03-17 MED ORDER — HYDROCODONE-ACETAMINOPHEN 7.5-500 MG PO TABS: 1.0000 | ORAL_TABLET | Freq: Four times a day (QID) | ORAL | Status: DC | PRN

## 2012-03-17 NOTE — Progress Notes (Signed)
  Subjective:    Patient ID: Denise Petersen, female    DOB: Apr 12, 1932, 78 y.o.   MRN: [id number]  HPI Here for 4 days of intermittent sharp severe pains in the right side and right lower chest region. No recent trauma. No recent coughing or URI symptoms. No fever or nausea. No change in BMs or urinations. No rashes seen. She recently recovered from a bout of shingles on the face. The pains last about 30-60 minutes at a time. It hurts for her to bend her body during these times or to stoop over. No pain on breathing. No SOB. Eating food has not effect on the pain.    Review of Systems  Constitutional: Negative.   HENT: Negative.   Eyes: Negative.   Respiratory: Negative.   Cardiovascular: Positive for chest pain. Negative for palpitations and leg swelling.  Gastrointestinal: Negative.   Genitourinary: Negative.        Objective:   Physical Exam  Constitutional: She appears well-developed and well-nourished.  Cardiovascular: Normal rate, regular rhythm, normal heart sounds and intact distal pulses.   Pulmonary/Chest: Effort normal and breath sounds normal. No respiratory distress. She has no wheezes. She has no rales.       Very tender along the lower ribs on the right chest and the right side.no crepitus.   Abdominal: Soft. Bowel sounds are normal. She exhibits no distension and no mass. There is no tenderness. There is no rebound and no guarding.          Assessment & Plan:  Rest, Diclofenac for inflammation, and Vicodin for pain. Recheck prn

## 2012-03-17 NOTE — Telephone Encounter (Signed)
Caller: Graciella/Patient; Patient Name: Denise Petersen; PCP: Gershon Crane [facility]); Best Callback Phone Number: [phone]; Reason for call: Upper Back Pain radiating into Right Breast, onset 9-6. All emergent symptoms ruled out per Back Symptoms Protocol, see in 24 hours due to pain intensifing with cough, sneeze or straining.  Patient verbalized understanding.

## 2012-03-17 NOTE — Telephone Encounter (Signed)
Please advise. Based on note, I cannot tell if appt is needed or not.

## 2012-03-17 NOTE — Telephone Encounter (Signed)
Yes, per Dr. Clent Ridges, pt needs to keep appt.

## 2012-03-21 ENCOUNTER — Encounter: Payer: Self-pay | Admitting: Family Medicine

## 2012-03-21 ENCOUNTER — Ambulatory Visit (INDEPENDENT_AMBULATORY_CARE_PROVIDER_SITE_OTHER): Payer: Medicare Other | Admitting: Family Medicine

## 2012-03-21 VITALS — BP 118/86 | HR 100 | Temp 98.3°F | Wt 199.0 lb

## 2012-03-21 DIAGNOSIS — B029 Zoster without complications: Secondary | ICD-10-CM

## 2012-03-21 MED ORDER — PREDNISONE 10 MG PO TABS: ORAL_TABLET | ORAL | Status: DC

## 2012-03-21 MED ORDER — VALACYCLOVIR HCL 1 G PO TABS: 1000.0000 mg | ORAL_TABLET | Freq: Three times a day (TID) | ORAL | Status: AC

## 2012-03-21 NOTE — Progress Notes (Signed)
  Subjective:    Patient ID: Denise Petersen, female    DOB: 10-17-1931, 78 y.o.   MRN: [id number]  HPI Here for continued pains in the right middle back, right side, and right lower chest. Now she has had a red spot on the back for 2 days. Of note she had 2 episodes of shingles on the face several months ago.    Review of Systems  Constitutional: Negative.   Respiratory: Negative.   Cardiovascular: Negative.   Musculoskeletal: Positive for back pain.       Objective:   Physical Exam  Constitutional: She appears well-developed and well-nourished.  Cardiovascular: Normal rate, regular rhythm, normal heart sounds and intact distal pulses.   Pulmonary/Chest: Effort normal and breath sounds normal.  Musculoskeletal:       Tender in the right middle back   Skin:       One area of macular erythema on the right middle back           Assessment & Plan:  Recurrent shingles. Get back on Valtrex 1000 mg tid for 10 days, then decrease to once a day. We will stay on this as a preventative measure for the time being. Given a prednisone taper.

## 2012-04-09 ENCOUNTER — Ambulatory Visit (INDEPENDENT_AMBULATORY_CARE_PROVIDER_SITE_OTHER): Payer: Medicare Other

## 2012-04-09 DIAGNOSIS — Z23 Encounter for immunization: Secondary | ICD-10-CM

## 2012-04-16 ENCOUNTER — Other Ambulatory Visit: Payer: Self-pay | Admitting: Family Medicine

## 2012-04-17 NOTE — Telephone Encounter (Signed)
Call in #[phone] with 5 rf 

## 2012-09-19 ENCOUNTER — Other Ambulatory Visit: Payer: Self-pay | Admitting: Family Medicine

## 2012-09-22 NOTE — Telephone Encounter (Signed)
Refill for 6 months. 

## 2012-12-16 ENCOUNTER — Other Ambulatory Visit: Payer: Self-pay | Admitting: Family Medicine

## 2013-03-16 ENCOUNTER — Other Ambulatory Visit: Payer: Self-pay | Admitting: Family Medicine

## 2013-03-18 NOTE — Telephone Encounter (Signed)
Call in 30 days of each with no rf. She needs an OV soon  

## 2013-03-19 NOTE — Telephone Encounter (Signed)
Pt following up on request for RX's. atorvastatin (LIPITOR) 40 MG tablet diazepam (VALIUM) 10 MG tablet levothyroxine (SYNTHROID, LEVOTHROID) 75 MCG tablet  Pt states she will make an appt when she gets home to her calender. Pharm: [facility]/ [address]

## 2013-03-19 NOTE — Telephone Encounter (Signed)
Okay for 90 days of each

## 2013-03-20 ENCOUNTER — Telehealth: Payer: Self-pay | Admitting: Family Medicine

## 2013-03-20 NOTE — Telephone Encounter (Signed)
Refill request for Diazepam 10 mg, Levothyroxine 75 mcg, & Atorvastatin 40 mg and send to [facility].

## 2013-03-20 NOTE — Telephone Encounter (Signed)
Refill Atorvastatin and Levothyroxine for one year. Call in Valium #90 with 5 rf

## 2013-03-23 MED ORDER — ATORVASTATIN CALCIUM 40 MG PO TABS: 40.0000 mg | ORAL_TABLET | Freq: Every day | ORAL | Status: DC

## 2013-03-23 MED ORDER — DIAZEPAM 10 MG PO TABS: 10.0000 mg | ORAL_TABLET | Freq: Three times a day (TID) | ORAL | Status: DC | PRN

## 2013-03-23 MED ORDER — LEVOTHYROXINE SODIUM 75 MCG PO TABS: 75.0000 ug | ORAL_TABLET | Freq: Every day | ORAL | Status: DC

## 2013-03-23 NOTE — Telephone Encounter (Signed)
I sent all 3 scripts to pharmacy. 

## 2013-04-20 ENCOUNTER — Other Ambulatory Visit: Payer: Self-pay

## 2013-04-20 ENCOUNTER — Encounter (INDEPENDENT_AMBULATORY_CARE_PROVIDER_SITE_OTHER): Payer: Self-pay

## 2013-04-20 ENCOUNTER — Encounter: Payer: Self-pay | Admitting: Family Medicine

## 2013-04-20 ENCOUNTER — Ambulatory Visit (INDEPENDENT_AMBULATORY_CARE_PROVIDER_SITE_OTHER): Payer: Medicare Other | Admitting: Family Medicine

## 2013-04-20 VITALS — BP 122/80 | HR 92 | Temp 98.3°F | Wt 193.0 lb

## 2013-04-20 DIAGNOSIS — G8929 Other chronic pain: Secondary | ICD-10-CM

## 2013-04-20 DIAGNOSIS — E785 Hyperlipidemia, unspecified: Secondary | ICD-10-CM

## 2013-04-20 DIAGNOSIS — E039 Hypothyroidism, unspecified: Secondary | ICD-10-CM

## 2013-04-20 DIAGNOSIS — Z1231 Encounter for screening mammogram for malignant neoplasm of breast: Secondary | ICD-10-CM

## 2013-04-20 DIAGNOSIS — Z23 Encounter for immunization: Secondary | ICD-10-CM

## 2013-04-20 DIAGNOSIS — M25569 Pain in unspecified knee: Secondary | ICD-10-CM

## 2013-04-20 DIAGNOSIS — F411 Generalized anxiety disorder: Secondary | ICD-10-CM

## 2013-04-20 LAB — POCT URINALYSIS DIPSTICK
Blood, UA: NEGATIVE
Ketones, UA: NEGATIVE
Protein, UA: NEGATIVE
Spec Grav, UA: <=1.005
Urobilinogen, UA: 0.2

## 2013-04-20 LAB — LIPID PANEL
Cholesterol: 139 mg/dL (ref 0–200)
HDL: 37 mg/dL — ABNORMAL LOW (ref >39.00)
Triglycerides: 212 mg/dL — ABNORMAL HIGH (ref 0.0–149.0)
VLDL: 42.4 mg/dL — ABNORMAL HIGH (ref 0.0–40.0)

## 2013-04-20 LAB — BASIC METABOLIC PANEL
BUN: 11 mg/dL (ref 6–23)
Calcium: 9.8 mg/dL (ref 8.4–10.5)
Creatinine, Ser: 1.1 mg/dL (ref 0.4–1.2)
GFR: 49.89 mL/min — ABNORMAL LOW (ref >60.00)
Glucose, Bld: 105 mg/dL — ABNORMAL HIGH (ref 70–99)

## 2013-04-20 LAB — HEPATIC FUNCTION PANEL
ALT: 25 U/L (ref 0–35)
Total Protein: 7.7 g/dL (ref 6.0–8.3)

## 2013-04-20 LAB — CBC WITH DIFFERENTIAL/PLATELET
Eosinophils Relative: 2.8 % (ref 0.0–5.0)
HCT: 44.9 % (ref 36.0–46.0)
Hemoglobin: 14.9 g/dL (ref 12.0–15.0)
Lymphs Abs: 2.5 10*3/uL (ref 0.7–4.0)
MCV: 83.5 fl (ref 78.0–100.0)
Monocytes Absolute: 0.6 10*3/uL (ref 0.1–1.0)
Neutro Abs: 6.4 10*3/uL (ref 1.4–7.7)
Platelets: 295 10*3/uL (ref 150.0–400.0)
RDW: 13.4 % (ref 11.5–14.6)
WBC: 9.7 10*3/uL (ref 4.5–10.5)

## 2013-04-20 MED ORDER — TRAMADOL HCL 50 MG PO TABS: 50.0000 mg | ORAL_TABLET | Freq: Four times a day (QID) | ORAL | Status: DC | PRN

## 2013-04-20 MED ORDER — PANTOPRAZOLE SODIUM 40 MG PO TBEC: 40.0000 mg | DELAYED_RELEASE_TABLET | Freq: Every day | ORAL | Status: DC

## 2013-04-20 MED ORDER — TRIAMTERENE-HCTZ 75-50 MG PO TABS: 1.0000 | ORAL_TABLET | Freq: Every day | ORAL | Status: DC

## 2013-04-20 NOTE — Addendum Note (Signed)
Addended by: Aniceto Boss A on: 04/20/2013 12:14 PM   Modules accepted: Orders

## 2013-04-20 NOTE — Progress Notes (Signed)
  Subjective:    Patient ID: Denise Petersen, female    DOB: [birth year], 79 y.o.   MRN: [id number]  HPI Here for labs and refills and also to discuss chronic pain in the right knee. This has bothered her for several years. No locking or giving way. It hurts to walk on it and especially to use the stairs. She has taken some of her husband's Tramadol and this helps for awhile.    Review of Systems  Constitutional: Negative.   Respiratory: Negative.   Cardiovascular: Negative.   Musculoskeletal: Positive for arthralgias. Negative for joint swelling.       Objective:   Physical Exam  Constitutional: She appears well-developed and well-nourished. No distress.  Cardiovascular: Normal rate, regular rhythm, normal heart sounds and intact distal pulses.   Pulmonary/Chest: Effort normal and breath sounds normal.  Musculoskeletal:  The right knee is not swollen but is tender in both the medial and lateral joint spaces. Lots of crepitus. Full ROM          Assessment & Plan:  Probable osteoarthritis of the knee. Given her own Tramadol to use for pain. Refer to Orthopedics. Get labs.

## 2013-05-05 ENCOUNTER — Ambulatory Visit
Admission: RE | Admit: 2013-05-05 | Discharge: 2013-05-05 | Disposition: A | Discharge status: Home / Self Care | Payer: Medicare Other | Source: Ambulatory Visit

## 2013-05-05 DIAGNOSIS — Z1231 Encounter for screening mammogram for malignant neoplasm of breast: Secondary | ICD-10-CM

## 2013-10-14 ENCOUNTER — Telehealth: Payer: Self-pay | Admitting: Family Medicine

## 2013-10-14 ENCOUNTER — Other Ambulatory Visit: Payer: Self-pay | Admitting: Family Medicine

## 2013-10-14 NOTE — Telephone Encounter (Signed)
Pt is needing new rx diazepam (VALIUM) 10 MG tablet and tramadol (ultram) 50 mg, sent to [facility] [address].

## 2013-10-14 NOTE — Telephone Encounter (Signed)
This is a duplicate note, we have received a electronic request also and I have already forwarded this to Dr. Fabian SharpPanosh.

## 2013-10-15 NOTE — Telephone Encounter (Signed)
Can order 10 days or each  30 of the valium and 40 of the tramadol pcp out of office  Further refills per dr Clent RidgesFRy

## 2013-10-21 ENCOUNTER — Telehealth: Payer: Self-pay | Admitting: Family Medicine

## 2013-10-21 NOTE — Telephone Encounter (Signed)
Pt is calling in regards to rx diazepam (VALIUM) 10 MG tablet and tramadol (ultram) 50 mg, due to dr.fry being out the pt only received a 30 day supply, and states when she went to the pharmacy to pick up the medicine, she was charged regular price as if she got her normal 90 tablets for diazepam and 120 tab for tramadol. Pt needs the rest of her meds sent to [facility] [address].

## 2013-10-21 NOTE — Telephone Encounter (Signed)
Call in Valium #90 with 5 rf, also Tramadol #120 with 5 rf

## 2013-10-23 MED ORDER — TRAMADOL HCL 50 MG PO TABS: ORAL_TABLET | ORAL | Status: DC

## 2013-10-23 MED ORDER — DIAZEPAM 10 MG PO TABS: ORAL_TABLET | ORAL | Status: DC

## 2013-10-23 NOTE — Telephone Encounter (Signed)
I called in both scripts. 

## 2014-04-13 ENCOUNTER — Other Ambulatory Visit: Payer: Self-pay | Admitting: Family Medicine

## 2014-04-26 ENCOUNTER — Other Ambulatory Visit: Payer: Self-pay

## 2014-04-26 DIAGNOSIS — Z1239 Encounter for other screening for malignant neoplasm of breast: Secondary | ICD-10-CM

## 2014-04-26 DIAGNOSIS — Z1231 Encounter for screening mammogram for malignant neoplasm of breast: Secondary | ICD-10-CM

## 2014-04-28 ENCOUNTER — Ambulatory Visit (INDEPENDENT_AMBULATORY_CARE_PROVIDER_SITE_OTHER): Payer: Medicare Other | Admitting: Family Medicine

## 2014-04-28 ENCOUNTER — Encounter: Payer: Self-pay | Admitting: Family Medicine

## 2014-04-28 VITALS — BP 165/92 | HR 88 | Temp 98.4°F | Ht 63.0 in | Wt 193.0 lb

## 2014-04-28 DIAGNOSIS — I10 Essential (primary) hypertension: Secondary | ICD-10-CM

## 2014-04-28 DIAGNOSIS — E038 Other specified hypothyroidism: Secondary | ICD-10-CM

## 2014-04-28 DIAGNOSIS — E785 Hyperlipidemia, unspecified: Secondary | ICD-10-CM

## 2014-04-28 DIAGNOSIS — Z293 Encounter for prophylactic fluoride administration: Secondary | ICD-10-CM

## 2014-04-28 DIAGNOSIS — Z418 Encounter for other procedures for purposes other than remedying health state: Secondary | ICD-10-CM

## 2014-04-28 DIAGNOSIS — Z23 Encounter for immunization: Secondary | ICD-10-CM

## 2014-04-28 DIAGNOSIS — M1990 Unspecified osteoarthritis, unspecified site: Secondary | ICD-10-CM

## 2014-04-28 LAB — POCT URINALYSIS DIPSTICK
BILIRUBIN UA: NEGATIVE
Glucose, UA: NEGATIVE
KETONES UA: NEGATIVE
LEUKOCYTES UA: NEGATIVE
Nitrite, UA: NEGATIVE
PH UA: 6
Protein, UA: NEGATIVE
RBC UA: NEGATIVE
Urobilinogen, UA: 0.2

## 2014-04-28 LAB — CBC WITH DIFFERENTIAL/PLATELET
BASOS PCT: 0.4 % (ref 0.0–3.0)
Basophils Absolute: 0 10*3/uL (ref 0.0–0.1)
EOS ABS: 0.1 10*3/uL (ref 0.0–0.7)
Eosinophils Relative: 1.5 % (ref 0.0–5.0)
HEMATOCRIT: 46.4 % — AB (ref 36.0–46.0)
Hemoglobin: 14.7 g/dL (ref 12.0–15.0)
LYMPHS PCT: 21.4 % (ref 12.0–46.0)
Lymphs Abs: 1.9 10*3/uL (ref 0.7–4.0)
MCHC: 31.6 g/dL (ref 30.0–36.0)
MCV: 85 fl (ref 78.0–100.0)
Monocytes Absolute: 0.5 10*3/uL (ref 0.1–1.0)
Monocytes Relative: 5.8 % (ref 3.0–12.0)
Neutro Abs: 6.4 10*3/uL (ref 1.4–7.7)
Neutrophils Relative %: 70.9 % (ref 43.0–77.0)
Platelets: 302 10*3/uL (ref 150.0–400.0)
RBC: 5.46 Mil/uL — AB (ref 3.87–5.11)
RDW: 13.2 % (ref 11.5–15.5)
WBC: 9 10*3/uL (ref 4.0–10.5)

## 2014-04-28 MED ORDER — LEVOTHYROXINE SODIUM 75 MCG PO TABS: ORAL_TABLET | ORAL | Status: DC

## 2014-04-28 MED ORDER — ATORVASTATIN CALCIUM 40 MG PO TABS: ORAL_TABLET | ORAL | Status: DC

## 2014-04-28 MED ORDER — DIAZEPAM 10 MG PO TABS: ORAL_TABLET | ORAL | Status: DC

## 2014-04-28 MED ORDER — TRAMADOL HCL 50 MG PO TABS: ORAL_TABLET | ORAL | Status: DC

## 2014-04-28 MED ORDER — TRIAMTERENE-HCTZ 75-50 MG PO TABS: 1.0000 | ORAL_TABLET | Freq: Every day | ORAL | Status: DC

## 2014-04-28 MED ORDER — PANTOPRAZOLE SODIUM 40 MG PO TBEC: 40.0000 mg | DELAYED_RELEASE_TABLET | ORAL | Status: DC | PRN

## 2014-04-28 NOTE — Progress Notes (Signed)
Pre visit review using our clinic review tool, if applicable. No additional management support is needed unless otherwise documented below in the visit note. 

## 2014-04-28 NOTE — Progress Notes (Signed)
   Subjective:    Patient ID: Denise Petersen, female    DOB: 1932-03-23, 80 y.o.   MRN: [id number]  HPI Here to follow up on numerous problems. She feels well in general but continues to have a lot of stress in her life. For some reason she has stopped taking Maxzide over the past year and her BP has crept up. She watches her diet but does not exercise much.    Review of Systems  Constitutional: Negative.   Respiratory: Negative.   Cardiovascular: Negative.   Gastrointestinal: Negative.   Neurological: Negative.        Objective:   Physical Exam  Constitutional: She is oriented to person, place, and time. She appears well-developed and well-nourished.  Neck: No thyromegaly present.  Cardiovascular: Normal rate, regular rhythm, normal heart sounds and intact distal pulses.   Pulmonary/Chest: Effort normal and breath sounds normal.  Lymphadenopathy:    She has no cervical adenopathy.  Neurological: She is alert and oriented to person, place, and time.  Psychiatric: She has a normal mood and affect. Her behavior is normal. Thought content normal.          Assessment & Plan:  Get fasting labs. Refilled meds. She will get back on Maxzide daily.

## 2014-04-29 ENCOUNTER — Telehealth: Payer: Self-pay | Admitting: Family Medicine

## 2014-04-29 LAB — HEPATIC FUNCTION PANEL
ALK PHOS: 82 U/L (ref 39–117)
ALT: 19 U/L (ref 0–35)
AST: 26 U/L (ref 0–37)
Albumin: 3.8 g/dL (ref 3.5–5.2)
BILIRUBIN TOTAL: 0.5 mg/dL (ref 0.2–1.2)
Bilirubin, Direct: 0.1 mg/dL (ref 0.0–0.3)
Total Protein: 7.6 g/dL (ref 6.0–8.3)

## 2014-04-29 LAB — BASIC METABOLIC PANEL
BUN: 10 mg/dL (ref 6–23)
CHLORIDE: 105 meq/L (ref 96–112)
CO2: 21 mEq/L (ref 19–32)
Calcium: 9.7 mg/dL (ref 8.4–10.5)
Creatinine, Ser: 1.1 mg/dL (ref 0.4–1.2)
GFR: 49.76 mL/min — AB (ref >60.00)
Glucose, Bld: 91 mg/dL (ref 70–99)
POTASSIUM: 5.2 meq/L — AB (ref 3.5–5.1)
SODIUM: 141 meq/L (ref 135–145)

## 2014-04-29 LAB — LIPID PANEL
CHOL/HDL RATIO: 3
CHOLESTEROL: 130 mg/dL (ref 0–200)
HDL: 39.3 mg/dL (ref >39.00)
LDL CALC: 60 mg/dL (ref 0–99)
NonHDL: 90.7
Triglycerides: 152 mg/dL — ABNORMAL HIGH (ref 0.0–149.0)
VLDL: 30.4 mg/dL (ref 0.0–40.0)

## 2014-04-29 LAB — TSH: TSH: 0.28 u[IU]/mL — ABNORMAL LOW (ref 0.35–4.50)

## 2014-04-29 NOTE — Telephone Encounter (Signed)
emmi mailed  °

## 2014-05-11 ENCOUNTER — Ambulatory Visit
Admission: RE | Admit: 2014-05-11 | Discharge: 2014-05-11 | Disposition: A | Discharge status: Home / Self Care | Payer: Medicare Other | Source: Ambulatory Visit

## 2014-05-11 DIAGNOSIS — Z1231 Encounter for screening mammogram for malignant neoplasm of breast: Secondary | ICD-10-CM

## 2014-06-28 ENCOUNTER — Telehealth: Payer: Self-pay | Admitting: Family Medicine

## 2014-06-28 ENCOUNTER — Ambulatory Visit (INDEPENDENT_AMBULATORY_CARE_PROVIDER_SITE_OTHER): Payer: Medicare Other | Admitting: Family Medicine

## 2014-06-28 ENCOUNTER — Encounter: Payer: Self-pay | Admitting: Family Medicine

## 2014-06-28 VITALS — BP 154/80 | HR 85 | Temp 98.6°F | Ht 63.0 in | Wt 190.0 lb

## 2014-06-28 DIAGNOSIS — R1013 Epigastric pain: Secondary | ICD-10-CM

## 2014-06-28 LAB — CBC WITH DIFFERENTIAL/PLATELET
BASOS ABS: 0 10*3/uL (ref 0.0–0.1)
Basophils Relative: 0.3 % (ref 0.0–3.0)
EOS ABS: 0.1 10*3/uL (ref 0.0–0.7)
Eosinophils Relative: 0.6 % (ref 0.0–5.0)
HEMATOCRIT: 42.7 % (ref 36.0–46.0)
Hemoglobin: 13.8 g/dL (ref 12.0–15.0)
LYMPHS ABS: 1.5 10*3/uL (ref 0.7–4.0)
LYMPHS PCT: 10.8 % — AB (ref 12.0–46.0)
MCHC: 32.3 g/dL (ref 30.0–36.0)
MCV: 82.9 fl (ref 78.0–100.0)
Monocytes Absolute: 0.9 10*3/uL (ref 0.1–1.0)
Monocytes Relative: 6.5 % (ref 3.0–12.0)
NEUTROS ABS: 11.3 10*3/uL — AB (ref 1.4–7.7)
Neutrophils Relative %: 81.8 % — ABNORMAL HIGH (ref 43.0–77.0)
Platelets: 509 10*3/uL — ABNORMAL HIGH (ref 150.0–400.0)
RBC: 5.15 Mil/uL — ABNORMAL HIGH (ref 3.87–5.11)
RDW: 12.5 % (ref 11.5–15.5)
WBC: 13.8 10*3/uL — ABNORMAL HIGH (ref 4.0–10.5)

## 2014-06-28 LAB — HEPATIC FUNCTION PANEL
ALBUMIN: 4.4 g/dL (ref 3.5–5.2)
ALT: 21 U/L (ref 0–35)
AST: 24 U/L (ref 0–37)
Alkaline Phosphatase: 85 U/L (ref 39–117)
BILIRUBIN TOTAL: 0.6 mg/dL (ref 0.2–1.2)
Bilirubin, Direct: 0.1 mg/dL (ref 0.0–0.3)
Total Protein: 7.4 g/dL (ref 6.0–8.3)

## 2014-06-28 LAB — BASIC METABOLIC PANEL
BUN: 23 mg/dL (ref 6–23)
CALCIUM: 10.2 mg/dL (ref 8.4–10.5)
CO2: 25 mEq/L (ref 19–32)
Chloride: 93 mEq/L — ABNORMAL LOW (ref 96–112)
Creatinine, Ser: 1.4 mg/dL — ABNORMAL HIGH (ref 0.4–1.2)
GFR: 37.22 mL/min — AB (ref >60.00)
GLUCOSE: 110 mg/dL — AB (ref 70–99)
Potassium: 5.2 mEq/L — ABNORMAL HIGH (ref 3.5–5.1)
Sodium: 129 mEq/L — ABNORMAL LOW (ref 135–145)

## 2014-06-28 LAB — AMYLASE: AMYLASE: 62 U/L (ref 27–131)

## 2014-06-28 LAB — LIPASE: Lipase: 35 U/L (ref 11.0–59.0)

## 2014-06-28 MED ORDER — PROMETHAZINE HCL 25 MG PO TABS: 25.0000 mg | ORAL_TABLET | ORAL | Status: DC | PRN

## 2014-06-28 NOTE — Progress Notes (Signed)
   Subjective:    Patient ID: Denise Petersen, female    DOB: 03-31-1932, 80 y.o.   MRN: [id number]  HPI Here for 6 weeks of constant dull aching in the epigastrium along with nausea. She has not vomited. No fever, no trouble swallowing. She has very little appetite and has been getting by with only toast and milk. She takes Protonix as usual. Her BMs are regular. She quit smoking about a month ago.    Review of Systems  Constitutional: Positive for appetite change. Negative for fever, chills, diaphoresis and unexpected weight change.  HENT: Negative.   Respiratory: Negative.   Cardiovascular: Negative.   Gastrointestinal: Positive for nausea and abdominal pain. Negative for vomiting, diarrhea, constipation, blood in stool, anal bleeding and rectal pain.  Genitourinary: Negative.        Objective:   Physical Exam  Constitutional: She appears well-developed and well-nourished. No distress.  Eyes: No scleral icterus.  Neck: Neck supple.  Cardiovascular: Normal rate, regular rhythm, normal heart sounds and intact distal pulses.   Pulmonary/Chest: Effort normal and breath sounds normal.  Abdominal: Soft. Bowel sounds are normal. She exhibits no distension and no mass. There is no tenderness. There is no rebound and no guarding.  Lymphadenopathy:    She has no cervical adenopathy.          Assessment & Plan:  She probably has a gall bladder problem. We will get labs today and set her up for an abdominal US soon. Try Phenergan for nausea.

## 2014-06-28 NOTE — Telephone Encounter (Signed)
Pt states dr fry was to call in Phenergan for nausea. But not at the pharm. Can you call that in for pt? [facility]/ [address]

## 2014-06-28 NOTE — Telephone Encounter (Signed)
Per Dr. Clent RidgesFry send in Phenergan 25 mg take 1 po q 4 hrs as needed with # 60 with 2 refills.

## 2014-06-28 NOTE — Progress Notes (Signed)
Pre visit review using our clinic review tool, if applicable. No additional management support is needed unless otherwise documented below in the visit note. 

## 2014-06-29 ENCOUNTER — Ambulatory Visit
Admission: RE | Admit: 2014-06-29 | Discharge: 2014-06-29 | Disposition: A | Discharge status: Home / Self Care | Payer: Medicare Other | Source: Ambulatory Visit | Attending: Family Medicine | Admitting: Family Medicine

## 2014-06-29 DIAGNOSIS — R1013 Epigastric pain: Secondary | ICD-10-CM

## 2014-06-29 NOTE — Telephone Encounter (Signed)
I sent script e-scribe and spoke with pt. 

## 2014-10-12 ENCOUNTER — Telehealth: Payer: Self-pay | Admitting: Family Medicine

## 2014-10-12 NOTE — Telephone Encounter (Signed)
I spoke with pt is she would like for us to send in new script for Valium take once per day and refill on Ultram.

## 2014-10-12 NOTE — Telephone Encounter (Signed)
Call in Valium to take daily, #90with one rf. Also call in Tramadol

## 2014-10-12 NOTE — Telephone Encounter (Signed)
Call in Tramadol #120 with 5 rf to take every 6 hours prn pain

## 2014-10-12 NOTE — Telephone Encounter (Signed)
I called to submit a PA request for Diazepam 90 per 30.  The CSR stated the patient's plan does not allow quantity limit request for the medication. Plan only allows 1 per day.

## 2014-10-13 MED ORDER — TRAMADOL HCL 50 MG PO TABS: ORAL_TABLET | ORAL | Status: DC

## 2014-10-13 MED ORDER — DIAZEPAM 10 MG PO TABS
10.0000 mg | ORAL_TABLET | Freq: Every day | ORAL | Status: DC
Start: 2014-10-13 — End: 2014-12-08

## 2014-10-13 NOTE — Telephone Encounter (Signed)
[facility]

## 2014-11-11 ENCOUNTER — Other Ambulatory Visit: Payer: Self-pay | Admitting: Family Medicine

## 2014-11-12 ENCOUNTER — Telehealth: Payer: Self-pay | Admitting: Family Medicine

## 2014-11-12 NOTE — Telephone Encounter (Signed)
[facility] [address] called and stated that he thinks the wrong directions were put in for the pt's Diazepam. You can reach him at [phone]   [facility] [address] [address]

## 2014-11-12 NOTE — Telephone Encounter (Signed)
I spoke with pharmacy and gave below information.  

## 2014-11-12 NOTE — Telephone Encounter (Signed)
Should directions be take three times per day?

## 2014-11-12 NOTE — Telephone Encounter (Signed)
She is supposed to take Diazepam only once a day. The rx for #90 was for 90 days

## 2014-12-08 ENCOUNTER — Other Ambulatory Visit: Payer: Self-pay | Admitting: Family Medicine

## 2014-12-08 ENCOUNTER — Telehealth: Payer: Self-pay | Admitting: Family Medicine

## 2014-12-08 NOTE — Telephone Encounter (Addendum)
Pt needs refill on diazepam 10 mg #90 w/refills calling to [facility] [address]. Pt takes meds three time a day

## 2014-12-08 NOTE — Telephone Encounter (Signed)
Refill for 6 months. 

## 2014-12-08 NOTE — Telephone Encounter (Signed)
I called in script 

## 2015-04-13 ENCOUNTER — Other Ambulatory Visit: Payer: Self-pay | Admitting: Family Medicine

## 2015-04-14 ENCOUNTER — Telehealth: Payer: Self-pay | Admitting: Family Medicine

## 2015-04-14 NOTE — Telephone Encounter (Signed)
° °  Pt request refill of the following: ° °   traMADol (ULTRAM) 50 MG tablet ° ° °Phamacy:  [facility] [address]  °

## 2015-04-15 NOTE — Telephone Encounter (Signed)
Call in #[phone] with 5 rf 

## 2015-04-18 MED ORDER — TRAMADOL HCL 50 MG PO TABS: ORAL_TABLET | ORAL | Status: AC

## 2015-04-18 NOTE — Telephone Encounter (Signed)
I called in script 

## 2015-04-18 NOTE — Addendum Note (Signed)
Addended by: Aniceto Boss A on: 04/18/2015 03:39 PM   Modules accepted: Orders

## 2015-04-26 ENCOUNTER — Telehealth: Payer: Self-pay | Admitting: Family Medicine

## 2015-04-26 ENCOUNTER — Encounter (HOSPITAL_COMMUNITY): Payer: Self-pay

## 2015-04-26 ENCOUNTER — Emergency Department (HOSPITAL_COMMUNITY)
Admission: EM | Admit: 2015-04-26 | Discharge: 2015-04-26 | Disposition: A | Discharge status: Home / Self Care | Payer: Medicare Other | Attending: Emergency Medicine | Admitting: Emergency Medicine

## 2015-04-26 DIAGNOSIS — L03211 Cellulitis of face: Secondary | ICD-10-CM | POA: Insufficient documentation

## 2015-04-26 DIAGNOSIS — L0201 Cutaneous abscess of face: Secondary | ICD-10-CM | POA: Diagnosis not present

## 2015-04-26 DIAGNOSIS — M1990 Unspecified osteoarthritis, unspecified site: Secondary | ICD-10-CM | POA: Diagnosis not present

## 2015-04-26 DIAGNOSIS — Z79899 Other long term (current) drug therapy: Secondary | ICD-10-CM | POA: Diagnosis not present

## 2015-04-26 DIAGNOSIS — Z7982 Long term (current) use of aspirin: Secondary | ICD-10-CM | POA: Diagnosis not present

## 2015-04-26 DIAGNOSIS — Z8619 Personal history of other infectious and parasitic diseases: Secondary | ICD-10-CM | POA: Diagnosis not present

## 2015-04-26 DIAGNOSIS — E785 Hyperlipidemia, unspecified: Secondary | ICD-10-CM | POA: Insufficient documentation

## 2015-04-26 DIAGNOSIS — Z720 Tobacco use: Secondary | ICD-10-CM | POA: Diagnosis not present

## 2015-04-26 DIAGNOSIS — F419 Anxiety disorder, unspecified: Secondary | ICD-10-CM | POA: Diagnosis not present

## 2015-04-26 DIAGNOSIS — I10 Essential (primary) hypertension: Secondary | ICD-10-CM

## 2015-04-26 DIAGNOSIS — K219 Gastro-esophageal reflux disease without esophagitis: Secondary | ICD-10-CM | POA: Insufficient documentation

## 2015-04-26 DIAGNOSIS — E079 Disorder of thyroid, unspecified: Secondary | ICD-10-CM | POA: Diagnosis not present

## 2015-04-26 DIAGNOSIS — L03811 Cellulitis of head [any part, except face]: Secondary | ICD-10-CM

## 2015-04-26 DIAGNOSIS — L02811 Cutaneous abscess of head [any part, except face]: Secondary | ICD-10-CM

## 2015-04-26 MED ORDER — LIDOCAINE-EPINEPHRINE (PF) 2 %-1:200000 IJ SOLN
10.0000 mL | Freq: Once | INTRAMUSCULAR | Status: AC
  Administered 2015-04-26: 10 mL
  Filled 2015-04-26: qty 20

## 2015-04-26 MED ORDER — DOXYCYCLINE HYCLATE 100 MG PO CAPS: 100.0000 mg | ORAL_CAPSULE | Freq: Two times a day (BID) | ORAL | Status: AC

## 2015-04-26 NOTE — ED Provider Notes (Signed)
CSN: [id number]     Arrival date & time 04/26/15  1515 History  By signing my name below, I, Freida Busman, attest that this documentation has been prepared under the direction and in the presence of non-physician practitioner, Arthor Captain, PA-C. Electronically Signed: Freida Busman, Scribe. 04/26/2015. 4:43 PM.  Chief Complaint  Patient presents with  . Abscess   The history is provided by the patient. No language interpreter was used.   HPI Comments:  Denise Petersen is a 81 y.o. female who presents to the Emergency Department complaining of a knot to her forehead that she first noticed 3 days ago. She states it has grown in size since onset. She reports itching at site, surrounding swelling and pain with palpation. Pt also notes mild drainage from site yesterday. She denies fever, and chills. No alleviating factors noted. Pt takes 81 mg ASA daily, no other blood thinners.   Pt has physical scheduled with PCP Clent Ridges) on 05/03/15.  Past Medical History  Diagnosis Date  . Hyperlipidemia   . Arthritis   . Headache(784.0)   . Thyroid disease   . Anxiety   . Osteopenia   . GERD (gastroesophageal reflux disease)   . Shingles    Past Surgical History  Procedure Laterality Date  . Breast surgery      biopsy  . Cataract extraction, bilateral  2010  . Colonoscopy  never   Family History  Problem Relation Age of Onset  . Arthritis      fhx   Social History  Substance Use Topics  . Smoking status: Current Every Day Smoker -- 0.50 packs/day    Types: Cigarettes  . Smokeless tobacco: Never Used     Comment: not for the last 2 weeks  . Alcohol Use: No   OB History    No data available     Review of Systems  Constitutional: Negative for fever and chills.  Respiratory: Negative for shortness of breath.   Cardiovascular: Negative for chest pain.  Skin: Positive for rash (Knot to forehead).   Allergies  Cefixime  Home Medications   Prior to Admission medications    Medication Sig Start Date End Date Taking? Authorizing Provider  aspirin 81 MG EC tablet Take 81 mg by mouth daily.      Historical Provider, MD  atorvastatin (LIPITOR) 40 MG tablet TAKE 1 TABLET BY MOUTH DAILY 04/28/14   Nelwyn Salisbury, MD  diazepam (VALIUM) 10 MG tablet TAKE 1 TABLET BY MOUTH THREE TIMES DAILY AS NEEDED 12/08/14   Nelwyn Salisbury, MD  doxycycline (VIBRAMYCIN) 100 MG capsule Take 1 capsule (100 mg total) by mouth 2 (two) times daily. 04/26/15   Arthor Captain, PA-C  levothyroxine (SYNTHROID, LEVOTHROID) 75 MCG tablet TAKE 1 TABLET BY MOUTH DAILY BEFORE BREAKFAST 04/28/14   Nelwyn Salisbury, MD  pantoprazole (PROTONIX) 40 MG tablet Take 1 tablet (40 mg total) by mouth as needed. 04/28/14   Nelwyn Salisbury, MD  promethazine (PHENERGAN) 25 MG tablet Take 1 tablet (25 mg total) by mouth every 4 (four) hours as needed for nausea or vomiting. 06/28/14   Nelwyn Salisbury, MD  traMADol (ULTRAM) 50 MG tablet TAKE 1 TABLET BY MOUTH EVERY 6 HOURS AS NEEDED FOR PAIN 04/18/15   Nelwyn Salisbury, MD  triamterene-hydrochlorothiazide (MAXZIDE) 75-50 MG per tablet Take 1 tablet by mouth daily. 04/28/14   Nelwyn Salisbury, MD   BP 142/105 mmHg  Pulse 100  Temp(Src) 98.1 F (36.7 C) (Oral)  Resp 18  SpO2 94% Physical Exam  Constitutional: She is oriented to person, place, and time. She appears well-developed and well-nourished. No distress.  HENT:  Head: Normocephalic and atraumatic.  Eyes: Conjunctivae are normal.  Cardiovascular: Normal rate.   Pulmonary/Chest: Effort normal.  Abdominal: She exhibits no distension.  Neurological: She is alert and oriented to person, place, and time.  Skin: Skin is warm and dry.  2 areas with pustular head on forehead Surrounding edema, poorly demarcated forehead that extend to nasal bridge Minimal tenderness  Psychiatric: She has a normal mood and affect.  Nursing note and vitals reviewed.         ED Course  Procedures   DIAGNOSTIC STUDIES:  Oxygen  Saturation is 94% on RA, adequate by my interpretation.    COORDINATION OF CARE:  4:12 PM Will I&D abscess in the ED and discharge with doxycycline. Advised pt to try and move up PCP appointment. Discussed treatment plan with pt at bedside and pt agreed to plan.  4:16 PM INCISION AND DRAINAGE PROCEDURE NOTE: Patient identification was confirmed and verbal consent was obtained. This procedure was performed by Arthor CaptainAbigail Anayi Bricco, PA-C at 4:16 PM. Site: forehead Sterile procedures observed Anesthetic used (type and amt): 2% lidocaine with epi Blade size: 11 Drainage: minimal Complexity: Simple Site anesthetized, incision made over site, wound drained and explored loculations, rinsed with copious amounts of normal saline, wound packed with sterile gauze, covered with dry, sterile dressing.  Pt tolerated procedure well without complications.  Instructions for care discussed verbally and pt provided with additional written instructions for homecare and f/u.  Labs Review Labs Reviewed  CULTURE, ROUTINE-ABSCESS    Imaging Review No results found.    EKG Interpretation None      MDM   Final diagnoses:  Cellulitis and abscess of head  Essential hypertension      Patient with infection and cellulitis of the face. Abscess culture collected. Seen in shared visit wit Dr. Adela LankFloyd. Doxycyline at discharge follow up in 2 days for a wound recheck. Lucila MaineI, Rissie Sculley, personally performed the services described in this documentation. All medical record entries made by the scribe were at my direction and in my presence.  I have reviewed the chart and discharge instructions and agree that the record reflects my personal performance and is accurate and complete. Arthor CaptainHarris, Ramondo Dietze.  04/26/2015. 11:19 PM.      Arthor CaptainAbigail Valeria Boza, PA-C 04/26/15 [id number]  Melene Planan Floyd, DO 04/27/15 [id number]  Melene Planan Floyd, DO 04/27/15 [id number]

## 2015-04-26 NOTE — ED Notes (Signed)
Pt reports abscess to forehead x 2 days. Reports some drainage from area, redness noted. Denies fever/chills. NAD.

## 2015-04-26 NOTE — Discharge Instructions (Signed)
Cellulitis °Cellulitis is an infection of the skin and the tissue beneath it. The infected area is usually red and tender. Cellulitis occurs most often in the arms and lower legs.  °CAUSES  °Cellulitis is caused by bacteria that enter the skin through cracks or cuts in the skin. The most common types of bacteria that cause cellulitis are staphylococci and streptococci. °SIGNS AND SYMPTOMS  °· Redness and warmth. °· Swelling. °· Tenderness or pain. °· Fever. °DIAGNOSIS  °Your health care provider can usually determine what is wrong based on a physical exam. Blood tests may also be done. °TREATMENT  °Treatment usually involves taking an antibiotic medicine. °HOME CARE INSTRUCTIONS  °· Take your antibiotic medicine as directed by your health care provider. Finish the antibiotic even if you start to feel better. °· Keep the infected arm or leg elevated to reduce swelling. °· Apply a warm cloth to the affected area up to 4 times per day to relieve pain. °· Take medicines only as directed by your health care provider. °· Keep all follow-up visits as directed by your health care provider. °SEEK MEDICAL CARE IF:  °· You notice red streaks coming from the infected area. °· Your red area gets larger or turns dark in color. °· Your bone or joint underneath the infected area becomes painful after the skin has healed. °· Your infection returns in the same area or another area. °· You notice a swollen bump in the infected area. °· You develop new symptoms. °· You have a fever. °SEEK IMMEDIATE MEDICAL CARE IF:  °· You feel very sleepy. °· You develop vomiting or diarrhea. °· You have a general ill feeling (malaise) with muscle aches and pains. °  °This information is not intended to replace advice given to you by your health care provider. Make sure you discuss any questions you have with your health care provider. °  °Document Released: 04/04/2005 Document Revised: 03/16/2015 Document Reviewed: 09/10/2011 °Elsevier Interactive  Patient Education ©2016 Elsevier Inc. ° °Incision and Drainage °Incision and drainage is a procedure in which a sac-like structure (cystic structure) is opened and drained. The area to be drained usually contains material such as pus, fluid, or blood.  °LET YOUR CAREGIVER KNOW ABOUT:  °· Allergies to medicine. °· Medicines taken, including vitamins, herbs, eyedrops, over-the-counter medicines, and creams. °· Use of steroids (by mouth or creams). °· Previous problems with anesthetics or numbing medicines. °· History of bleeding problems or blood clots. °· Previous surgery. °· Other health problems, including diabetes and kidney problems. °· Possibility of pregnancy, if this applies. °RISKS AND COMPLICATIONS °· Pain. °· Bleeding. °· Scarring. °· Infection. °BEFORE THE PROCEDURE  °You may need to have an ultrasound or other imaging tests to see how large or deep your cystic structure is. Blood tests may also be used to determine if you have an infection or how severe the infection is. You may need to have a tetanus shot. °PROCEDURE  °The affected area is cleaned with a cleaning fluid. The cyst area will then be numbed with a medicine (local anesthetic). A small incision will be made in the cystic structure. A syringe or catheter may be used to drain the contents of the cystic structure, or the contents may be squeezed out. The area will then be flushed with a cleansing solution. After cleansing the area, it is often gently packed with a gauze or another wound dressing. Once it is packed, it will be covered with gauze and tape or some   other type of wound dressing.  °AFTER THE PROCEDURE  °· Often, you will be allowed to go home right after the procedure. °· You may be given antibiotic medicine to prevent or heal an infection. °· If the area was packed with gauze or some other wound dressing, you will likely need to come back in 1 to 2 days to get it removed. °· The area should heal in about 14 days. °  °This information  is not intended to replace advice given to you by your health care provider. Make sure you discuss any questions you have with your health care provider. °  °Document Released: 12/19/2000 Document Revised: 12/25/2011 Document Reviewed: 08/20/2011 °Elsevier Interactive Patient Education ©2016 Elsevier Inc. ° °

## 2015-04-29 ENCOUNTER — Telehealth (HOSPITAL_COMMUNITY): Payer: Self-pay

## 2015-04-29 LAB — CULTURE, ROUTINE-ABSCESS: SPECIAL REQUESTS: NORMAL

## 2015-04-29 NOTE — Telephone Encounter (Signed)
Results called from [facility].  (+) MRSA.  Rx given in ED for Doxycycline -> sensitive to the same.  Pt informed of dx, tx prescribed appropriate and provided MRSA education. Pt voiced understanding

## 2015-05-03 ENCOUNTER — Ambulatory Visit (INDEPENDENT_AMBULATORY_CARE_PROVIDER_SITE_OTHER): Payer: Medicare Other | Admitting: Family Medicine

## 2015-05-03 ENCOUNTER — Encounter: Payer: Self-pay | Admitting: Family Medicine

## 2015-05-03 VITALS — BP 164/86 | Temp 98.7°F | Ht 63.0 in | Wt 198.0 lb

## 2015-05-03 DIAGNOSIS — I10 Essential (primary) hypertension: Secondary | ICD-10-CM | POA: Diagnosis not present

## 2015-05-03 DIAGNOSIS — F411 Generalized anxiety disorder: Secondary | ICD-10-CM

## 2015-05-03 DIAGNOSIS — E785 Hyperlipidemia, unspecified: Secondary | ICD-10-CM

## 2015-05-03 DIAGNOSIS — Z23 Encounter for immunization: Secondary | ICD-10-CM | POA: Diagnosis not present

## 2015-05-03 DIAGNOSIS — E039 Hypothyroidism, unspecified: Secondary | ICD-10-CM | POA: Diagnosis not present

## 2015-05-03 DIAGNOSIS — M1990 Unspecified osteoarthritis, unspecified site: Secondary | ICD-10-CM

## 2015-05-03 DIAGNOSIS — Z9889 Other specified postprocedural states: Secondary | ICD-10-CM

## 2015-05-03 LAB — CBC WITH DIFFERENTIAL/PLATELET
Basophils Absolute: 0.1 10*3/uL (ref 0.0–0.1)
Basophils Relative: 0.5 % (ref 0.0–3.0)
EOS PCT: 0.8 % (ref 0.0–5.0)
Eosinophils Absolute: 0.1 10*3/uL (ref 0.0–0.7)
HEMATOCRIT: 45.1 % (ref 36.0–46.0)
HEMOGLOBIN: 14.6 g/dL (ref 12.0–15.0)
LYMPHS ABS: 1.9 10*3/uL (ref 0.7–4.0)
LYMPHS PCT: 13.9 % (ref 12.0–46.0)
MCHC: 32.3 g/dL (ref 30.0–36.0)
MCV: 84.3 fl (ref 78.0–100.0)
MONOS PCT: 6.2 % (ref 3.0–12.0)
Monocytes Absolute: 0.9 10*3/uL (ref 0.1–1.0)
NEUTROS PCT: 78.6 % — AB (ref 43.0–77.0)
Neutro Abs: 10.9 10*3/uL — ABNORMAL HIGH (ref 1.4–7.7)
Platelets: 462 10*3/uL — ABNORMAL HIGH (ref 150.0–400.0)
RBC: 5.35 Mil/uL — AB (ref 3.87–5.11)
RDW: 12.9 % (ref 11.5–15.5)
WBC: 13.8 10*3/uL — AB (ref 4.0–10.5)

## 2015-05-03 LAB — POCT URINALYSIS DIPSTICK
Bilirubin, UA: NEGATIVE
Blood, UA: NEGATIVE
GLUCOSE UA: NEGATIVE
Ketones, UA: NEGATIVE
Nitrite, UA: NEGATIVE
Protein, UA: NEGATIVE
UROBILINOGEN UA: 0.2
pH, UA: 5.5

## 2015-05-03 LAB — LIPID PANEL
CHOL/HDL RATIO: 3
Cholesterol: 121 mg/dL (ref 0–200)
HDL: 44.2 mg/dL (ref >39.00)
LDL Cholesterol: 57 mg/dL (ref 0–99)
NONHDL: 77.22
Triglycerides: 102 mg/dL (ref 0.0–149.0)
VLDL: 20.4 mg/dL (ref 0.0–40.0)

## 2015-05-03 LAB — HEPATIC FUNCTION PANEL
ALT: 24 U/L (ref 0–35)
AST: 23 U/L (ref 0–37)
Albumin: 4.5 g/dL (ref 3.5–5.2)
Alkaline Phosphatase: 106 U/L (ref 39–117)
BILIRUBIN DIRECT: 0.2 mg/dL (ref 0.0–0.3)
BILIRUBIN TOTAL: 0.6 mg/dL (ref 0.2–1.2)
Total Protein: 7.6 g/dL (ref 6.0–8.3)

## 2015-05-03 LAB — BASIC METABOLIC PANEL
BUN: 23 mg/dL (ref 6–23)
CHLORIDE: 95 meq/L — AB (ref 96–112)
CO2: 29 mEq/L (ref 19–32)
CREATININE: 1.29 mg/dL — AB (ref 0.40–1.20)
Calcium: 11.1 mg/dL — ABNORMAL HIGH (ref 8.4–10.5)
GFR: 42.16 mL/min — ABNORMAL LOW (ref >60.00)
Glucose, Bld: 130 mg/dL — ABNORMAL HIGH (ref 70–99)
POTASSIUM: 4.8 meq/L (ref 3.5–5.1)
Sodium: 138 mEq/L (ref 135–145)

## 2015-05-03 LAB — TSH: TSH: 0.38 u[IU]/mL (ref 0.35–4.50)

## 2015-05-03 MED ORDER — DIAZEPAM 10 MG PO TABS: 10.0000 mg | ORAL_TABLET | Freq: Three times a day (TID) | ORAL | Status: AC | PRN

## 2015-05-03 MED ORDER — LEVOTHYROXINE SODIUM 75 MCG PO TABS: ORAL_TABLET | ORAL | Status: AC

## 2015-05-03 MED ORDER — ATORVASTATIN CALCIUM 40 MG PO TABS
ORAL_TABLET | ORAL | Status: AC
Start: 2015-05-03 — End: ?

## 2015-05-03 MED ORDER — TRIAMTERENE-HCTZ 75-50 MG PO TABS: 1.0000 | ORAL_TABLET | Freq: Every day | ORAL | Status: AC

## 2015-05-03 MED ORDER — FUROSEMIDE 20 MG PO TABS: 20.0000 mg | ORAL_TABLET | Freq: Every day | ORAL | Status: AC | PRN

## 2015-05-03 NOTE — Progress Notes (Signed)
Pre visit review using our clinic review tool, if applicable. No additional management support is needed unless otherwise documented below in the visit note. 

## 2015-05-03 NOTE — Progress Notes (Signed)
   Subjective:    Patient ID: Denise Petersen, female    DOB: [birth year], 81 y.o.   MRN: [id number]  HPI 81 yr old female for a well exam. She has a few things to discuss. First she asks me to check her forehead wound. On 04-26-15 she went to the ER for am infected sebaceous cyst on the forehead. This was lanced there and she was started on Doxycycline. The wound culture was positive for a MRSA infection which was susceptible to Doxycycline. The wound is healing nicely and she has no discomfort. Otherwise she asks for something to take for swelling. Her feet and hands swell from time to time, especially in hot weather.    Review of Systems  Constitutional: Negative.   HENT: Negative.   Eyes: Negative.   Respiratory: Negative.   Cardiovascular: Positive for leg swelling. Negative for chest pain and palpitations.  Gastrointestinal: Negative.   Genitourinary: Negative for dysuria, urgency, frequency, hematuria, flank pain, decreased urine volume, enuresis, difficulty urinating, pelvic pain and dyspareunia.  Skin: Positive for wound. Negative for color change, pallor and rash.  Neurological: Negative.   Psychiatric/Behavioral: Negative.        Objective:   Physical Exam  Constitutional: She is oriented to person, place, and time. She appears well-developed and well-nourished. No distress.  HENT:  Head: Normocephalic and atraumatic.  Right Ear: External ear normal.  Left Ear: External ear normal.  Nose: Nose normal.  Mouth/Throat: Oropharynx is clear and moist. No oropharyngeal exudate.  Eyes: Conjunctivae and EOM are normal. Pupils are equal, round, and reactive to light. No scleral icterus.  Neck: Normal range of motion. Neck supple. No JVD present. No thyromegaly present.  Cardiovascular: Normal rate, regular rhythm, normal heart sounds and intact distal pulses.  Exam reveals no gallop and no friction rub.   No murmur heard. EKG normal   Pulmonary/Chest: Effort normal and breath  sounds normal. No respiratory distress. She has no wheezes. She has no rales. She exhibits no tenderness.  Abdominal: Soft. Bowel sounds are normal. She exhibits no distension and no mass. There is no tenderness. There is no rebound and no guarding.  Musculoskeletal: Normal range of motion. She exhibits no edema or tenderness.  Lymphadenopathy:    She has no cervical adenopathy.  Neurological: She is alert and oriented to person, place, and time. She has normal reflexes. No cranial nerve deficit. She exhibits normal muscle tone. Coordination normal.  Skin: Skin is warm and dry. No rash noted. No erythema.  The forehead looks clean with no erythema. Only some small pieces of scab remain  Psychiatric: She has a normal mood and affect. Her behavior is normal. Judgment and thought content normal.          Assessment & Plan:  Her HTN is stable. The MRSA infection is resolving. She will finish out the Doxycycline. For the thyroid level and lipids, she will have fasting labs drawn. Her arthritis is stable and she takes Tramadol prn. Try Lasix prn fluid buildup.

## 2015-05-03 NOTE — Addendum Note (Signed)
Addended by: Aniceto BossNIMMONS, SYLVIA A on: 05/03/2015 11:31 AM   Modules accepted: Orders

## 2015-08-10 NOTE — Telephone Encounter (Signed)
ERROR

## 2015-09-05 ENCOUNTER — Inpatient Hospital Stay (HOSPITAL_COMMUNITY)
Admission: EM | Admit: 2015-09-05 | Discharge: 2015-09-07 | DRG: 871 | Disposition: E | Payer: Medicare Other | Attending: Internal Medicine | Admitting: Internal Medicine

## 2015-09-05 ENCOUNTER — Emergency Department (HOSPITAL_COMMUNITY): Payer: Medicare Other

## 2015-09-05 ENCOUNTER — Inpatient Hospital Stay (HOSPITAL_COMMUNITY): Payer: Medicare Other

## 2015-09-05 ENCOUNTER — Encounter (HOSPITAL_COMMUNITY): Payer: Self-pay | Admitting: Emergency Medicine

## 2015-09-05 DIAGNOSIS — F419 Anxiety disorder, unspecified: Secondary | ICD-10-CM | POA: Diagnosis present

## 2015-09-05 DIAGNOSIS — A419 Sepsis, unspecified organism: Secondary | ICD-10-CM | POA: Diagnosis present

## 2015-09-05 DIAGNOSIS — E039 Hypothyroidism, unspecified: Secondary | ICD-10-CM | POA: Diagnosis present

## 2015-09-05 DIAGNOSIS — K219 Gastro-esophageal reflux disease without esophagitis: Secondary | ICD-10-CM | POA: Diagnosis present

## 2015-09-05 DIAGNOSIS — Z79899 Other long term (current) drug therapy: Secondary | ICD-10-CM

## 2015-09-05 DIAGNOSIS — J9601 Acute respiratory failure with hypoxia: Secondary | ICD-10-CM | POA: Diagnosis present

## 2015-09-05 DIAGNOSIS — J09X1 Influenza due to identified novel influenza A virus with pneumonia: Secondary | ICD-10-CM | POA: Diagnosis present

## 2015-09-05 DIAGNOSIS — Z888 Allergy status to other drugs, medicaments and biological substances status: Secondary | ICD-10-CM | POA: Diagnosis not present

## 2015-09-05 DIAGNOSIS — I10 Essential (primary) hypertension: Secondary | ICD-10-CM | POA: Diagnosis present

## 2015-09-05 DIAGNOSIS — I409 Acute myocarditis, unspecified: Secondary | ICD-10-CM | POA: Diagnosis present

## 2015-09-05 DIAGNOSIS — N179 Acute kidney failure, unspecified: Secondary | ICD-10-CM | POA: Diagnosis present

## 2015-09-05 DIAGNOSIS — E785 Hyperlipidemia, unspecified: Secondary | ICD-10-CM | POA: Diagnosis present

## 2015-09-05 DIAGNOSIS — I469 Cardiac arrest, cause unspecified: Secondary | ICD-10-CM | POA: Diagnosis not present

## 2015-09-05 DIAGNOSIS — R739 Hyperglycemia, unspecified: Secondary | ICD-10-CM | POA: Diagnosis present

## 2015-09-05 DIAGNOSIS — J189 Pneumonia, unspecified organism: Secondary | ICD-10-CM

## 2015-09-05 DIAGNOSIS — Z87891 Personal history of nicotine dependence: Secondary | ICD-10-CM

## 2015-09-05 DIAGNOSIS — Z452 Encounter for adjustment and management of vascular access device: Secondary | ICD-10-CM

## 2015-09-05 LAB — CBC
HCT: 40 % (ref 36.0–46.0)
Hemoglobin: 12.7 g/dL (ref 12.0–15.0)
MCH: 26.2 pg (ref 26.0–34.0)
MCHC: 31.8 g/dL (ref 30.0–36.0)
MCV: 82.6 fL (ref 78.0–100.0)
Platelets: 184 10*3/uL (ref 150–400)
RBC: 4.84 MIL/uL (ref 3.87–5.11)
RDW: 13.1 % (ref 11.5–15.5)
WBC: 4 10*3/uL (ref 4.0–10.5)

## 2015-09-05 LAB — CBC WITH DIFFERENTIAL/PLATELET
BASOS PCT: 0 %
Basophils Absolute: 0 10*3/uL (ref 0.0–0.1)
EOS PCT: 0 %
Eosinophils Absolute: 0 10*3/uL (ref 0.0–0.7)
HEMATOCRIT: 44 % (ref 36.0–46.0)
HEMOGLOBIN: 14.7 g/dL (ref 12.0–15.0)
Lymphocytes Relative: 13 %
Lymphs Abs: 2.2 10*3/uL (ref 0.7–4.0)
MCH: 27.5 pg (ref 26.0–34.0)
MCHC: 33.4 g/dL (ref 30.0–36.0)
MCV: 82.4 fL (ref 78.0–100.0)
Monocytes Absolute: 1.4 10*3/uL — ABNORMAL HIGH (ref 0.1–1.0)
Monocytes Relative: 8 %
Neutro Abs: 13.5 10*3/uL — ABNORMAL HIGH (ref 1.7–7.7)
Neutrophils Relative %: 79 %
Platelets: 272 10*3/uL (ref 150–400)
RBC: 5.34 MIL/uL — ABNORMAL HIGH (ref 3.87–5.11)
RDW: 13.1 % (ref 11.5–15.5)
WBC: 17.1 10*3/uL — ABNORMAL HIGH (ref 4.0–10.5)

## 2015-09-05 LAB — INFLUENZA PANEL BY PCR (TYPE A & B)
H1N1 flu by pcr: NOT DETECTED
INFLAPCR: POSITIVE — AB
Influenza B By PCR: NEGATIVE

## 2015-09-05 LAB — COMPREHENSIVE METABOLIC PANEL
ALBUMIN: 2.3 g/dL — AB (ref 3.5–5.0)
ALK PHOS: 78 U/L (ref 38–126)
ALT: 44 U/L (ref 14–54)
ALT: 42 U/L (ref 14–54)
ANION GAP: 15 (ref 5–15)
AST: 89 U/L — ABNORMAL HIGH (ref 15–41)
AST: 96 U/L — ABNORMAL HIGH (ref 15–41)
Albumin: 3.6 g/dL (ref 3.5–5.0)
Alkaline Phosphatase: 46 U/L (ref 38–126)
Anion gap: 19 — ABNORMAL HIGH (ref 5–15)
BILIRUBIN TOTAL: 0.4 mg/dL (ref 0.3–1.2)
BUN: 23 mg/dL — ABNORMAL HIGH (ref 6–20)
BUN: 25 mg/dL — ABNORMAL HIGH (ref 6–20)
CALCIUM: 8.4 mg/dL — AB (ref 8.9–10.3)
CO2: 17 mmol/L — AB (ref 22–32)
CO2: 23 mmol/L (ref 22–32)
CREATININE: 2.12 mg/dL — AB (ref 0.44–1.00)
Calcium: 7 mg/dL — ABNORMAL LOW (ref 8.9–10.3)
Chloride: 87 mmol/L — ABNORMAL LOW (ref 101–111)
Chloride: 96 mmol/L — ABNORMAL LOW (ref 101–111)
Creatinine, Ser: 2.47 mg/dL — ABNORMAL HIGH (ref 0.44–1.00)
GFR calc Af Amer: 24 mL/min — ABNORMAL LOW (ref >60)
GFR calc Af Amer: 20 mL/min — ABNORMAL LOW (ref >60)
GFR calc non Af Amer: 21 mL/min — ABNORMAL LOW (ref >60)
GFR calc non Af Amer: 17 mL/min — ABNORMAL LOW (ref >60)
GLUCOSE: 396 mg/dL — AB (ref 65–99)
GLUCOSE: 131 mg/dL — AB (ref 65–99)
POTASSIUM: 3.3 mmol/L — AB (ref 3.5–5.1)
Potassium: 4.4 mmol/L (ref 3.5–5.1)
SODIUM: 134 mmol/L — AB (ref 135–145)
Sodium: 123 mmol/L — ABNORMAL LOW (ref 135–145)
TOTAL PROTEIN: 4.6 g/dL — AB (ref 6.5–8.1)
Total Bilirubin: 0.7 mg/dL (ref 0.3–1.2)
Total Protein: 7.3 g/dL (ref 6.5–8.1)

## 2015-09-05 LAB — I-STAT ARTERIAL BLOOD GAS, ED
ACID-BASE DEFICIT: 6 mmol/L — AB (ref 0.0–2.0)
Bicarbonate: 19.9 mEq/L — ABNORMAL LOW (ref 20.0–24.0)
O2 SAT: 99 %
PH ART: 7.273 — AB (ref 7.350–7.450)
PO2 ART: 182 mmHg — AB (ref 80.0–100.0)
Patient temperature: 103
TCO2: 21 mmol/L (ref 0–100)
pCO2 arterial: 44.3 mmHg (ref 35.0–45.0)

## 2015-09-05 LAB — URINALYSIS, ROUTINE W REFLEX MICROSCOPIC
Bilirubin Urine: NEGATIVE
Glucose, UA: 500 mg/dL — AB
Ketones, ur: NEGATIVE mg/dL
LEUKOCYTES UA: NEGATIVE
NITRITE: NEGATIVE
PH: 6 (ref 5.0–8.0)
Protein, ur: 100 mg/dL — AB
SPECIFIC GRAVITY, URINE: 1.014 (ref 1.005–1.030)

## 2015-09-05 LAB — TRIGLYCERIDES: Triglycerides: 127 mg/dL (ref <150)

## 2015-09-05 LAB — TROPONIN I
Troponin I: 0.44 ng/mL — ABNORMAL HIGH (ref <0.031)
Troponin I: 3.15 ng/mL (ref <0.031)
Troponin I: 5.2 ng/mL (ref <0.031)

## 2015-09-05 LAB — URINE MICROSCOPIC-ADD ON

## 2015-09-05 LAB — I-STAT CG4 LACTIC ACID, ED
LACTIC ACID, VENOUS: 2.88 mmol/L — AB (ref 0.5–2.0)
Lactic Acid, Venous: 7.08 mmol/L (ref 0.5–2.0)

## 2015-09-05 LAB — BRAIN NATRIURETIC PEPTIDE: B Natriuretic Peptide: 98.1 pg/mL (ref 0.0–100.0)

## 2015-09-05 LAB — MAGNESIUM: MAGNESIUM: 1.4 mg/dL — AB (ref 1.7–2.4)

## 2015-09-05 LAB — PHOSPHORUS: PHOSPHORUS: 3.2 mg/dL (ref 2.5–4.6)

## 2015-09-05 LAB — GLUCOSE, CAPILLARY: Glucose-Capillary: 211 mg/dL — ABNORMAL HIGH (ref 65–99)

## 2015-09-05 LAB — MRSA PCR SCREENING: MRSA by PCR: POSITIVE — AB

## 2015-09-05 LAB — LACTIC ACID, PLASMA: Lactic Acid, Venous: 6.7 mmol/L (ref 0.5–2.0)

## 2015-09-05 MED ORDER — ARTIFICIAL TEARS OP OINT: 1.0000 "application " | TOPICAL_OINTMENT | Freq: Three times a day (TID) | OPHTHALMIC | Status: DC

## 2015-09-05 MED ORDER — INSULIN ASPART 100 UNIT/ML ~~LOC~~ SOLN: 2.0000 [IU] | SUBCUTANEOUS | Status: DC

## 2015-09-05 MED ORDER — FENTANYL CITRATE (PF) 100 MCG/2ML IJ SOLN: 100.0000 ug | Freq: Once | INTRAMUSCULAR | Status: DC

## 2015-09-05 MED ORDER — SODIUM CHLORIDE 0.9 % IV SOLN
25.0000 ug/h | INTRAVENOUS | Status: DC
  Administered 2015-09-05: 25 ug/h via INTRAVENOUS
  Filled 2015-09-05: qty 50

## 2015-09-05 MED ORDER — ROCURONIUM BROMIDE 50 MG/5ML IV SOLN
90.0000 mg | Freq: Once | INTRAVENOUS | Status: AC
  Administered 2015-09-05: 90 mg via INTRAVENOUS
  Filled 2015-09-05: qty 9

## 2015-09-05 MED ORDER — MIDAZOLAM HCL 2 MG/2ML IJ SOLN
2.0000 mg | Freq: Once | INTRAMUSCULAR | Status: DC
Start: 2015-09-05 — End: 2015-09-05

## 2015-09-05 MED ORDER — PROPOFOL 1000 MG/100ML IV EMUL
0.0000 ug/kg/min | INTRAVENOUS | Status: DC
  Administered 2015-09-05: 50 ug/kg/min via INTRAVENOUS
  Filled 2015-09-05: qty 100

## 2015-09-05 MED ORDER — LEVOFLOXACIN IN D5W 750 MG/150ML IV SOLN: 750.0000 mg | INTRAVENOUS | Status: DC

## 2015-09-05 MED ORDER — CISATRACURIUM BOLUS VIA INFUSION
0.1000 mg/kg | Freq: Once | INTRAVENOUS | Status: DC
  Filled 2015-09-05: qty 9

## 2015-09-05 MED ORDER — ASPIRIN 325 MG PO TABS
325.0000 mg | ORAL_TABLET | Freq: Every day | ORAL | Status: DC
  Filled 2015-09-05: qty 1

## 2015-09-05 MED ORDER — FENTANYL CITRATE (PF) 100 MCG/2ML IJ SOLN: 100.0000 ug | Freq: Once | INTRAMUSCULAR | Status: DC | PRN

## 2015-09-05 MED ORDER — FAMOTIDINE 40 MG/5ML PO SUSR: 20.0000 mg | Freq: Every day | ORAL | Status: DC

## 2015-09-05 MED ORDER — FENTANYL CITRATE (PF) 100 MCG/2ML IJ SOLN
50.0000 ug | Freq: Once | INTRAMUSCULAR | Status: AC
  Administered 2015-09-05: 50 ug via INTRAVENOUS
  Filled 2015-09-05: qty 2

## 2015-09-05 MED ORDER — HEPARIN (PORCINE) IN NACL 100-0.45 UNIT/ML-% IJ SOLN: 1000.0000 [IU]/h | INTRAMUSCULAR | Status: DC

## 2015-09-05 MED ORDER — VANCOMYCIN HCL IN DEXTROSE 1-5 GM/200ML-% IV SOLN
1000.0000 mg | Freq: Once | INTRAVENOUS | Status: AC
  Administered 2015-09-05: 1000 mg via INTRAVENOUS
  Filled 2015-09-05: qty 200

## 2015-09-05 MED ORDER — ACETAMINOPHEN 650 MG RE SUPP
650.0000 mg | RECTAL | Status: DC | PRN
  Filled 2015-09-05: qty 1

## 2015-09-05 MED ORDER — SUCCINYLCHOLINE CHLORIDE 20 MG/ML IJ SOLN
100.0000 mg | Freq: Once | INTRAMUSCULAR | Status: AC
  Administered 2015-09-05: 100 mg via INTRAVENOUS
  Filled 2015-09-05: qty 5

## 2015-09-05 MED ORDER — FENTANYL BOLUS VIA INFUSION
25.0000 ug | INTRAVENOUS | Status: DC | PRN
  Filled 2015-09-05: qty 25

## 2015-09-05 MED ORDER — HEPARIN BOLUS VIA INFUSION
4000.0000 [IU] | Freq: Once | INTRAVENOUS | Status: DC
  Filled 2015-09-05: qty 4000

## 2015-09-05 MED ORDER — CISATRACURIUM BESYLATE (PF) 200 MG/20ML IV SOLN
3.0000 ug/kg/min | INTRAVENOUS | Status: DC
  Filled 2015-09-05: qty 20

## 2015-09-05 MED ORDER — SODIUM CHLORIDE 0.9 % IV SOLN
1.0000 mg/h | INTRAVENOUS | Status: DC
  Administered 2015-09-05: 2 mg/h via INTRAVENOUS
  Filled 2015-09-05: qty 10

## 2015-09-05 MED ORDER — SODIUM CHLORIDE 0.9 % IV SOLN: 250.0000 mL | INTRAVENOUS | Status: DC | PRN

## 2015-09-05 MED ORDER — PROPOFOL 1000 MG/100ML IV EMUL
INTRAVENOUS | Status: AC
  Filled 2015-09-05: qty 100

## 2015-09-05 MED ORDER — SODIUM CHLORIDE 0.9 % IV BOLUS (SEPSIS)
1000.0000 mL | INTRAVENOUS | Status: AC
  Administered 2015-09-05 (×3): 1000 mL via INTRAVENOUS

## 2015-09-05 MED ORDER — MIDAZOLAM HCL 2 MG/2ML IJ SOLN: 2.0000 mg | Freq: Once | INTRAMUSCULAR | Status: DC | PRN

## 2015-09-05 MED ORDER — FENTANYL BOLUS VIA INFUSION
50.0000 ug | INTRAVENOUS | Status: DC | PRN
  Filled 2015-09-05: qty 50

## 2015-09-05 MED ORDER — MIDAZOLAM BOLUS VIA INFUSION
2.0000 mg | INTRAVENOUS | Status: DC | PRN
  Filled 2015-09-05: qty 2

## 2015-09-05 MED ORDER — ETOMIDATE 2 MG/ML IV SOLN
20.0000 mg | Freq: Once | INTRAVENOUS | Status: AC
  Administered 2015-09-05: 20 mg via INTRAVENOUS

## 2015-09-05 MED ORDER — MIDAZOLAM HCL 2 MG/2ML IJ SOLN: 1.0000 mg | INTRAMUSCULAR | Status: DC | PRN

## 2015-09-05 MED ORDER — SODIUM BICARBONATE 8.4 % IV SOLN
100.0000 meq | Freq: Once | INTRAVENOUS | Status: DC
  Filled 2015-09-05: qty 100

## 2015-09-05 MED ORDER — FAMOTIDINE 40 MG/5ML PO SUSR
20.0000 mg | Freq: Every day | ORAL | Status: DC
  Filled 2015-09-05: qty 2.5

## 2015-09-05 MED ORDER — SODIUM CHLORIDE 0.9 % IV SOLN
0.0300 [IU]/min | INTRAVENOUS | Status: DC
  Filled 2015-09-05: qty 2

## 2015-09-05 MED ORDER — MAGNESIUM SULFATE 2 GM/50ML IV SOLN: 2.0000 g | Freq: Once | INTRAVENOUS | Status: DC

## 2015-09-05 MED ORDER — EPINEPHRINE HCL 1 MG/ML IJ SOLN
0.5000 ug/min | INTRAVENOUS | Status: DC
  Administered 2015-09-05: 10 ug/min via INTRAVENOUS
  Filled 2015-09-05: qty 4

## 2015-09-05 MED ORDER — NOREPINEPHRINE BITARTRATE 1 MG/ML IV SOLN
0.0000 ug/min | INTRAVENOUS | Status: DC
  Administered 2015-09-05: 20 ug/min via INTRAVENOUS
  Filled 2015-09-05: qty 16

## 2015-09-05 MED ORDER — MIDAZOLAM HCL 2 MG/2ML IJ SOLN
1.0000 mg | INTRAMUSCULAR | Status: DC | PRN
  Administered 2015-09-05 (×2): 1 mg via INTRAVENOUS
  Filled 2015-09-05 (×2): qty 2

## 2015-09-05 MED ORDER — LEVOFLOXACIN IN D5W 750 MG/150ML IV SOLN
750.0000 mg | Freq: Once | INTRAVENOUS | Status: AC
  Administered 2015-09-05: 750 mg via INTRAVENOUS
  Filled 2015-09-05: qty 150

## 2015-09-05 MED ORDER — FAMOTIDINE 40 MG/5ML PO SUSR
20.0000 mg | Freq: Two times a day (BID) | ORAL | Status: DC
  Filled 2015-09-05: qty 2.5

## 2015-09-05 MED ORDER — VANCOMYCIN HCL IN DEXTROSE 1-5 GM/200ML-% IV SOLN: 1000.0000 mg | INTRAVENOUS | Status: DC

## 2015-09-05 MED ORDER — OSELTAMIVIR PHOSPHATE 6 MG/ML PO SUSR
30.0000 mg | Freq: Every day | ORAL | Status: DC
  Filled 2015-09-05: qty 5

## 2015-09-05 MED ORDER — SODIUM CHLORIDE 0.9 % IV BOLUS (SEPSIS): 500.0000 mL | Freq: Once | INTRAVENOUS | Status: DC

## 2015-09-05 MED ORDER — ANTISEPTIC ORAL RINSE SOLUTION (CORINZ): 7.0000 mL | Freq: Four times a day (QID) | OROMUCOSAL | Status: DC

## 2015-09-05 MED ORDER — PROPOFOL 1000 MG/100ML IV EMUL
5.0000 ug/kg/min | Freq: Once | INTRAVENOUS | Status: AC
  Administered 2015-09-05: 15 ug/kg/min via INTRAVENOUS

## 2015-09-05 MED ORDER — SODIUM BICARBONATE 8.4 % IV SOLN
INTRAVENOUS | Status: AC
  Filled 2015-09-05: qty 100

## 2015-09-05 MED ORDER — CHLORHEXIDINE GLUCONATE 0.12% ORAL RINSE (MEDLINE KIT): 15.0000 mL | Freq: Two times a day (BID) | OROMUCOSAL | Status: DC

## 2015-09-05 MED ORDER — HEPARIN SODIUM (PORCINE) 5000 UNIT/ML IJ SOLN
5000.0000 [IU] | Freq: Three times a day (TID) | INTRAMUSCULAR | Status: DC
  Filled 2015-09-05: qty 1

## 2015-09-05 MED FILL — Medication: Qty: 1 | Status: AC

## 2015-09-06 ENCOUNTER — Telehealth: Payer: Self-pay

## 2015-09-06 LAB — URINE CULTURE: Culture: NO GROWTH

## 2015-09-06 NOTE — Telephone Encounter (Signed)
On 09/06/2015 I received a death certificate from [facility] (original). The death certificate is for burial. The patient is a patient of Doctor McQuaid. The death certificate will be taken to [facility] (2100) tomorrow am for signature. On 10/03/2015 I received the death certificate back from Doctor McQuaid. I got the death certificate ready and called the funeral home to let them know the death certificate is ready for pickup.

## 2015-09-07 NOTE — Progress Notes (Signed)
CRITICAL VALUE ALERT  Critical value received:  Lactic acid 6.7  Date of notification: Sep 24, 2015  Time of notification:  1241  Critical value read back:yes  Nurse who received alert:Valincia Touch RN  MD notified (1st page): Dr. Chales Salmon  Time of first page:12:42  MD notified (2nd page):  Time of second page:  Responding MD:Dr. Max Fickle  Time MD responded: 12:42

## 2015-09-07 NOTE — Progress Notes (Signed)
°  Accompanied pt.'s husband to ER room. Prayed with him and the pt. Who was unresponsive. Provided spiritual support to the pt.'s spouse.

## 2015-09-07 NOTE — Progress Notes (Signed)
Patient remains tachypneic with propofol gtt and versed prn. Patient has temp of 104. Ice packs placed to cool patient. Cooling blanket ordered and MD notified. Received order for fentanyl gtt. Will continue with current plan of care.

## 2015-09-07 NOTE — ED Notes (Signed)
Attempted report 

## 2015-09-07 NOTE — Progress Notes (Signed)
Pharmacy Antibiotic Note  Denise Petersen is a 82 y.o. female admitted on 09-16-15 with respiratory distress - required intubation in the ED.  Pharmacy has been consulted for Vancomycin and Levaquin dosing for sepsis. Received Vancomycin 1gm and Levaquin  in ED ~0400.   Plan: Levaquin  IV q48h Vancomycin 1gm Iv q24h Will f/u micro data, renal function, and pt's clinical condition Vanc trough prn   Height:  (167.6 cm) Weight: 197 lb 15.6 oz (89.8 kg) IBW/kg (Calculated) : 59.3  Temp (24hrs), Avg:102.5 F (39.2 C), Min:101.3 F (38.5 C), Max:103.4 F (39.7 C)   Recent Labs Lab 09/16/2015 0317 Sep 16, 2015 0338  WBC 17.1*  --   CREATININE 2.12*  --   LATICACIDVEN  --  7.08*    Estimated Creatinine Clearance: 23.5 mL/min (by C-G formula based on Cr of 2.12).    Allergies  Allergen Reactions  . Cefixime     REACTION: unspecified    Antimicrobials this admission: 2/27 Levaquin >>  2/27 Vanc >>   Dose adjustments this admission: n/a  Microbiology results: 2/27 BCx:  2/27 UCx:    Thank you for allowing pharmacy to be a part of this patient's care.  Christoper Fabian, PharmD, BCPS Clinical pharmacist, pager [phone] 2015-09-16 5:53 AM

## 2015-09-07 NOTE — Progress Notes (Signed)
[facility]  I was called emergently to the bedside to assess Ms. Reckart who was admitted from the ER earlier this morning.  She developed a cough yesterday and came to the [facility] ER with progressive dyspnea and confusion.  She was intubated and brought to the ICU.  FLU A positive.  I was called to the bedside for progressive hypoxemia and later shock.  Her breath sounds were equal bilaterally but coarse with vent supported breaths.  Her O2 saturation was  78-85% on 100% FiO2, PEEP 20 on PRVC.  We bagged her continuously, ordered a CXR which showed progressive interstitial infiltrates.    I placed a CVL emergently.  She developed worsening shock so we started levophed.  She would never really improve in terms of oxygenation despite anything we did for over 90 minutes.  Eventually she had a cardiac arrest (PEA) and received 5 minutes of CPR.  I performed an emergent bronchoscopy and found that her airways were red, beefy, and bloody consistent with acute viral bronchitis (influenza).  We added levophed, sodium bicarbonate and epinephrine.  Despite this she developed cardiac arrest again.  Bedside echo did not show tamponade, LVEF on sub-xyphoid view showed globally depressed LVEF.  We then performed CPR again for 10 minutes.  The patients husband stated that she never wanted life support and asked that we stop.  Time of death was 1257pm.  My cc time was 1 hour and 45 minutes outside procedures  Heber [place], MD [place] Pager: [phone] Cell: [phone] After 3pm or if no response, call [phone]

## 2015-09-07 NOTE — ED Notes (Signed)
Pt arrives via EMS with c/o coughing and respiratory distress onset tonight, pt was tachypnic but alert upon EMS arrival. As call progressed, pt's LOC declined and EMS began bagging with BVM and placed NPA. More alert upon arrival to trauma room, looking around and following commands however still requiring BVM. EMS attempted intubation in the field but were unsuccessful. Placed 20G IV R hand.

## 2015-09-07 NOTE — Progress Notes (Signed)
Wasted 40 cc of versed and 240 cc of fentanyl with second RN, Ardith Dark.

## 2015-09-07 NOTE — ED Notes (Signed)
Husband at bedside with Dr. Blinda Leatherwood at this time receiving update, reporting onset of fever yesterday. Husband not a good historian, is not sure what kind of meds patient is on.

## 2015-09-07 NOTE — Progress Notes (Signed)
ANTICOAGULATION CONSULT NOTE - Initial Consult  Pharmacy Consult for heparin Indication: chest pain/ACS  Allergies  Allergen Reactions  . Cefixime     REACTION: unspecified    Patient Measurements: Height:  (167.6 cm) Weight: 197 lb 15.6 oz (89.8 kg) IBW/kg (Calculated) : 59.3 Heparin Dosing Weight: 78.8 kg  Vital Signs: Temp: 105.1 F (40.6 C) (02/27 1030) Temp Source: Rectal (02/27 0328) BP: 94/39 mmHg (02/27 1030) Pulse Rate: 119 (02/27 1030)  Labs:  Recent Labs  Sep 28, 2015 0317 2015-09-28 0917  HGB 14.7  --   HCT 44.0  --   PLT 272  --   CREATININE 2.12*  --   TROPONINI 0.44* 3.15*    Estimated Creatinine Clearance: 23.5 mL/min (by C-G formula based on Cr of 2.12).   Medical History: Past Medical History  Diagnosis Date  . Hyperlipidemia   . Arthritis   . Headache(784.0)   . Thyroid disease   . Anxiety   . Osteopenia   . GERD (gastroesophageal reflux disease)   . Shingles     Assessment: 82 yo f admitted on 2/27 with respiratory distress, found flu A +.  Patient decompensated this AM - on 100% FiO2 on the ventilator with O2 sats in the 60s-70s.  Pharmacy is consulted to begin heparin for ACS vs PE. She has already received a 4,000 unit bolus. Hgb ok 14.7, plts 272.   Goal of Therapy:  Heparin level 0.3-0.7 units/ml Monitor platelets by anticoagulation protocol: Yes   Plan:  - Start heparin infusion at 1,000 units/hr - 8-hr HL @ 1930 - Daily HL, CBC - Monitor s/sx of bleeding  Payton Prinsen L. Roseanne Reno, PharmD PGY2 Infectious Diseases Pharmacy Resident Pager: [phone] 09/28/15 11:19 AM

## 2015-09-07 NOTE — ED Provider Notes (Signed)
CSN: [id number]     Arrival date & time 09/15/15  0310 History  By signing my name below, I, Budd Palmer, attest that this documentation has been prepared under the direction and in the presence of Gilda Crease, MD. Electronically Signed: Budd Palmer, ED Scribe. 09/15/15. 3:37 AM.    Chief Complaint  Patient presents with  . Respiratory Distress   The history is provided by the patient and the EMS personnel. No language interpreter was used.   HPI Comments: Denise Petersen is a 82 y.o. female with a PMHx of HLD and thyroid disease brought in by ambulance, who presents to the Emergency Department complaining of respiratory distress onset tonight. Per EMS, they were called out by pt's husband tonight due to pt having worsening SOB. They note pt has been coughing all day prior to this. They were unable to give pt a C-pap due to pt having LOC. They report pt having a gag reflex and being able to squeeze EMS's fingers when asked to do so. Pt's last BP was measured at 140/110 with sinus tachycardia by EMS.   Per husband, pt has been feeling ill for the past 4 days and was coughing incessantly all day yesterday. Petersen denies pt having any PMHx of lung problems. Petersen states pt is a former smoker.   Past Medical History  Diagnosis Date  . Hyperlipidemia   . Arthritis   . Headache(784.0)   . Thyroid disease   . Anxiety   . Osteopenia   . GERD (gastroesophageal reflux disease)   . Shingles    Past Surgical History  Procedure Laterality Date  . Breast surgery      biopsy  . Cataract extraction, bilateral  2010  . Colonoscopy  never   Family History  Problem Relation Age of Onset  . Arthritis      fhx   Social History  Substance Use Topics  . Smoking status: Former Smoker -- 0.50 packs/day    Types: Cigarettes  . Smokeless tobacco: Never Used     Comment: quit at least 1 year ago  . Alcohol Use: No   OB History    No data available     Review of Systems   Respiratory: Positive for cough and shortness of breath.   Neurological: Positive for syncope.  All other systems reviewed and are negative.   Allergies  Cefixime  Home Medications   Prior to Admission medications   Medication Sig Start Date End Date Taking? Authorizing Provider  diazepam (VALIUM) 10 MG tablet Take 1 tablet (10 mg total) by mouth 3 (three) times daily as needed. 05/03/15  Yes Nelwyn Salisbury, MD  levothyroxine (SYNTHROID, LEVOTHROID) 75 MCG tablet TAKE 1 TABLET BY MOUTH DAILY BEFORE BREAKFAST 05/03/15  Yes Nelwyn Salisbury, MD  traMADol (ULTRAM) 50 MG tablet TAKE 1 TABLET BY MOUTH EVERY 6 HOURS AS NEEDED FOR PAIN 04/18/15  Yes Nelwyn Salisbury, MD  triamterene-hydrochlorothiazide (MAXZIDE) 75-50 MG tablet Take 1 tablet by mouth daily. 05/03/15  Yes Nelwyn Salisbury, MD  atorvastatin (LIPITOR) 40 MG tablet TAKE 1 TABLET BY MOUTH DAILY Patient not taking: Reported on 09-15-2015 05/03/15   Nelwyn Salisbury, MD  doxycycline (VIBRAMYCIN) 100 MG capsule Take 1 capsule (100 mg total) by mouth 2 (two) times daily. Patient not taking: Reported on 09-15-2015 04/26/15   Arthor Captain, PA-C  furosemide (LASIX) 20 MG tablet Take 1 tablet (20 mg total) by mouth daily as needed for fluid. Patient not taking:  Reported on 2015-09-28 05/03/15   Nelwyn Salisbury, MD   BP 114/49 mmHg  Pulse 126  Temp(Src) 103.3 F (39.6 C) (Rectal)  Resp 29  Ht 5\' 6"  (1.676 m)  Wt 197 lb 15.6 oz (89.8 kg)  BMI 31.97 kg/m2  SpO2 100% Physical Exam  Constitutional: She is oriented to person, place, and time. She appears well-developed and well-nourished. No distress.  HENT:  Head: Normocephalic and atraumatic.  Right Ear: Hearing normal.  Left Ear: Hearing normal.  Nose: Nose normal.  Mouth/Throat: Oropharynx is clear and moist and mucous membranes are normal.  Eyes: Conjunctivae and EOM are normal. Pupils are equal, round, and reactive to light.  Neck: Normal range of motion. Neck supple.  Cardiovascular:  Regular rhythm, S1 normal and S2 normal.  Exam reveals no gallop and no friction rub.   No murmur heard. Pulmonary/Chest: Tachypnea noted. She is in respiratory distress. She has rhonchi. She has rales. She exhibits no tenderness.  Tachypnic, diffuse rales and rhonchi, being assisted by bagged mask  Abdominal: Soft. Normal appearance and bowel sounds are normal. There is no hepatosplenomegaly. There is no tenderness. There is no rebound, no guarding, no tenderness at McBurney's point and negative Murphy's sign. No hernia.  Musculoskeletal: Normal range of motion. She exhibits edema (slight, pitting to the BLE).  Neurological: She is alert and oriented to person, place, and time. She has normal strength. No cranial nerve deficit or sensory deficit. Coordination normal. GCS eye subscore is 4. GCS verbal subscore is 5. GCS motor subscore is 6.  Skin: Skin is warm, dry and intact. No rash noted. No cyanosis.  Psychiatric: She has a normal mood and affect. Her speech is normal and behavior is normal. Thought content normal.  Nursing note and vitals reviewed.   ED Course  .Intubation Date/Time: 2015/09/28 4:07 AM Performed by: Gilda Crease Authorized by: Gilda Crease Consent: The procedure was performed in an emergent situation. Required items: required blood products, implants, devices, and special equipment available Patient identity confirmed: arm band and hospital-assigned identification number Time out: Immediately prior to procedure a "time out" was called to verify the correct patient, procedure, equipment, support staff and site/side marked as required. Indications: respiratory distress and  respiratory failure Intubation method: video-assisted Patient status: paralyzed (RSI) Pretreatment medications: none Sedatives: etomidate Paralytic: succinylcholine Tube size: 7.5 mm Tube type: cuffed Number of attempts: 1 Cricoid pressure: no Cords visualized:  yes Post-procedure assessment: chest rise and CO2 detector Breath sounds: equal Cuff inflated: yes ETT to lip: 24 cm Tube secured with: ETT holder Chest x-ray interpreted by me and radiologist. Chest x-ray findings: endotracheal tube in appropriate position Patient tolerance: Patient tolerated the procedure well with no immediate complications    DIAGNOSTIC STUDIES: Oxygen Saturation is 100% on RA, normal by my interpretation.    COORDINATION OF CARE: 3:22 AM - Discussed plans to order diagnostic studies and imaging. Pt advised of plan for treatment and pt agrees.  3:36 AM- Discussed probable infection and plans to order diagnostic studies. Husband advised of plan for treatment and Petersen agrees.   CRITICAL CARE Performed by: Gilda Crease, MD Total critical care time: 30 minutes Critical care time was exclusive of separately billable procedures and treating other patients. Critical care was necessary to treat or prevent imminent or life-threatening deterioration. Critical care was time spent personally by me on the following activities: development of treatment plan with patient and/or surrogate as well as nursing, discussions with consultants, evaluation of patient's  response to treatment, examination of patient, obtaining history from patient or surrogate, ordering and performing treatments and interventions, ordering and review of laboratory studies, ordering and review of radiographic studies, pulse oximetry and re-evaluation of patient's condition.   Labs Review Labs Reviewed  CBC WITH DIFFERENTIAL/PLATELET - Abnormal; Notable for the following:    WBC 17.1 (*)    RBC 5.34 (*)    All other components within normal limits  URINALYSIS, ROUTINE W REFLEX MICROSCOPIC (NOT AT [facility]) - Abnormal; Notable for the following:    Glucose, UA 500 (*)    Hgb urine dipstick LARGE (*)    Protein, ur 100 (*)    All other components within normal limits  URINE MICROSCOPIC-ADD ON -  Abnormal; Notable for the following:    Squamous Epithelial / LPF 0-5 (*)    Bacteria, UA RARE (*)    Casts HYALINE CASTS (*)    All other components within normal limits  I-STAT CG4 LACTIC ACID, ED - Abnormal; Notable for the following:    Lactic Acid, Venous 7.08 (*)    All other components within normal limits  I-STAT ARTERIAL BLOOD GAS, ED - Abnormal; Notable for the following:    pH, Arterial 7.273 (*)    pO2, Arterial 182.0 (*)    Bicarbonate 19.9 (*)    Acid-base deficit 6.0 (*)    All other components within normal limits  CULTURE, BLOOD (ROUTINE X 2)  CULTURE, BLOOD (ROUTINE X 2)  URINE CULTURE  COMPREHENSIVE METABOLIC PANEL  TROPONIN I  BRAIN NATRIURETIC PEPTIDE    Imaging Review Dg Chest Portable 1 View  09/16/15  CLINICAL DATA:  Endotracheal tube placement. Orogastric tube placement. Code sepsis. Initial encounter. EXAM: PORTABLE CHEST 1 VIEW COMPARISON:  None. FINDINGS: The patient's endotracheal tube is seen ending 3 cm above the carina. An enteric tube is noted extending below the diaphragm. Mild bibasilar opacities may reflect atelectasis or possibly mild pneumonia. No pleural effusion or pneumothorax is seen. The cardiomediastinal silhouette is borderline normal in size. No acute osseous abnormalities are identified. IMPRESSION: 1. Endotracheal tube seen ending 3 cm above the carina. 2. Enteric tube noted extending below the diaphragm. 3. Mild bibasilar airspace opacities may reflect atelectasis or possibly mild pneumonia. Electronically Signed   By: Roanna Raider M.D.   On: 09/16/15 03:59   I have personally reviewed and evaluated these images and lab results as part of my medical decision-making.   EKG Interpretation   Date/Time:  Monday 2015-09-16 03:40:16 EST Ventricular Rate:  117 PR Interval:  128 QRS Duration: 74 QT Interval:  305 QTC Calculation: 425 R Axis:   66 Text Interpretation:  Sinus tachycardia Atrial premature complexes in  couplets  Abnormal R-wave progression, early transition Repol abnrm, severe  global ischemia (LM/MVD) No previous tracing Confirmed by POLLINA  MD,  CHRISTOPHER [phone]) on 09/16/2015 3:42:21 AM      MDM   Final diagnoses:  CAP (community acquired pneumonia)  Sepsis, due to unspecified organism [facility])    Patient presented to the ER with severe respiratory distress. Husband reports that she has been sick since yesterday with cough and congestion. Cough worsened overnight and she became extremely weak. EMS report that the patient was in severe distress upon their arrival. They were unable to intubate her because she does have a gag reflex, however, she had very poor respiratory drive. Patient was transported with assisted respirations by bag valve mask.  At arrival to the ER patient was in severe  distress. She was breathing 40 times a minute with heart rate in the 140s. Emergency intubation was performed. She was found to have a rectal temperature 103.4. Sepsis was felt to be present with likely source being pneumonia. Patient initiated on protocol IV fluids, protocol antibiotics. She was found to have a markedly elevated lactate above 7. Chest x-ray looks like early pneumonia. Patient will require admission to the ICU for further management.  I personally performed the services described in this documentation, which was scribed in my presence. The recorded information has been reviewed and is accurate.    Gilda Crease, MD 09-15-15 [phone]

## 2015-09-07 NOTE — H&P (Signed)
PULMONARY / CRITICAL CARE MEDICINE   Name: Denise Petersen MRN: [id number] DOB: 26-Jun-1932    ADMISSION DATE:  2015/09/07 CONSULTATION DATE: 09-07-15  REFERRING MD:  EDP  CHIEF COMPLAINT:  Shortness of breath, fever  HISTORY OF PRESENT ILLNESS:   Denise Petersen is an 17F brought in by EMS for respiratory distress. Per her husband she began having fever yesterday with progressive shortness of breath and difficulty breathing. Intubation was attempted in the field but was unsuccessful. She was emergently intubated on arrival to the ED. She is unable to provide any history. Her husband reports that she began coughing a few days ago and thought she had caught a cold. On Saturday (2/25) she was having fever and chill and sweating through her clothes. She slipped to the floor off of the commode and was unable to get up by herself. Her husband eventually got her to the couch, but she was too weak for him to help her to the bathroom the next time and he called EMS. She had not been bringing up sputum. No nausea / vomiting / diarrhea / constipation. No chest pain. Poor appetite. Of note, they lost their only son at the age of 43 one week ago; the funeral was this past Thursday.   In the ED: febrile to 103, WBC 17.1, Cr. 2.12 (from 1.29 4 months ago), Na 123, Cl 87, BUN 23, AST 89, glucose 396, troponin 0.44, LA 7.08, UA benign. Post intubation, BP is borderline.   PAST MEDICAL HISTORY :  She  has a past medical history of Hyperlipidemia; Arthritis; Headache(784.0); Thyroid disease; Anxiety; Osteopenia; GERD (gastroesophageal reflux disease); and Shingles.  PAST SURGICAL HISTORY: She  has past surgical history that includes Breast surgery; Cataract extraction, bilateral (2010); and Colonoscopy (never).  Allergies  Allergen Reactions  . Cefixime     REACTION: unspecified    No current facility-administered medications on file prior to encounter.   Current Outpatient Prescriptions on File Prior to  Encounter  Medication Sig  . diazepam (VALIUM) 10 MG tablet Take 1 tablet (10 mg total) by mouth 3 (three) times daily as needed.  Marland Kitchen levothyroxine (SYNTHROID, LEVOTHROID) 75 MCG tablet TAKE 1 TABLET BY MOUTH DAILY BEFORE BREAKFAST  . traMADol (ULTRAM) 50 MG tablet TAKE 1 TABLET BY MOUTH EVERY 6 HOURS AS NEEDED FOR PAIN  . triamterene-hydrochlorothiazide (MAXZIDE) 75-50 MG tablet Take 1 tablet by mouth daily.  Marland Kitchen atorvastatin (LIPITOR) 40 MG tablet TAKE 1 TABLET BY MOUTH DAILY (Patient not taking: Reported on Sep 07, 2015)  . doxycycline (VIBRAMYCIN) 100 MG capsule Take 1 capsule (100 mg total) by mouth 2 (two) times daily. (Patient not taking: Reported on 09-07-15)  . furosemide (LASIX) 20 MG tablet Take 1 tablet (20 mg total) by mouth daily as needed for fluid. (Patient not taking: Reported on September 07, 2015)    FAMILY HISTORY:  Her indicated that her mother is deceased. She indicated that her father is deceased.   SOCIAL HISTORY: She  reports that she has quit smoking. Her smoking use included Cigarettes. She smoked 0.50 packs per day. She has never used smokeless tobacco. She reports that she does not drink alcohol or use illicit drugs.  REVIEW OF SYSTEMS:   Unable to obtain.   VITAL SIGNS: Temp:  [101.3 F (38.5 C)-103.4 F (39.7 C)] 101.3 F (38.5 C) (02/27 0515) Pulse Rate:  [97-141] 97 (02/27 0515) Resp:  [25-42] 28 (02/27 0515) BP: (102-188)/(49-112) 112/51 mmHg (02/27 0515) SpO2:  [96 %-100 %] 99 % (02/27 0515) FiO2 (%):  [  70 %-100 %] 70 % (02/27 0410) Weight:  [89.8 kg (197 lb 15.6 oz)] 89.8 kg (197 lb 15.6 oz) (02/27 0321)  HEMODYNAMICS:    VENTILATOR SETTINGS: Vent Mode:  [-] PRVC FiO2 (%):  [70 %-100 %] 70 % Set Rate:  [14 bmp] 14 bmp Vt Set:  [470 mL] 470 mL PEEP:  [5 cmH20] 5 cmH20 Plateau Pressure:  [21 cmH20] 21 cmH20  INTAKE / OUTPUT:    PHYSICAL EXAMINATION:  General Well nourished, well developed, obese, intubated, no apparent distress  HEENT No gross  abnormalities. ETT in place.    Pulmonary Bilateral ronchi without wheezes on anterior exam. Vent-assisted effort, symmetrical expansion.   Cardiovascular Tachycardic, intermittently irregular rhythm. S1, s2. No m/r/g. Distal pulses palpable.  Abdomen Obese. Soft, non-tender, non-distended, positive bowel sounds, no palpable organomegaly or masses. Normoresonant to percussion.  Musculoskeletal Moves all extremities. No bony abnormalities.   Lymphatics No cervical, supraclavicular or axillary adenopathy.   Neurologic Opens eyes to voice. No commands (on propofol @ ).    Skin/Integuement No rash, no cyanosis, no clubbing.      LABS:  BMET  Recent Labs Lab September 28, 2015 0317  NA 123*  K 4.4  CL 87*  CO2 17*  BUN 23*  CREATININE 2.12*  GLUCOSE 396*    Electrolytes  Recent Labs Lab 09-28-2015 0317  CALCIUM 8.4*    CBC  Recent Labs Lab September 28, 2015 0317  WBC 17.1*  HGB 14.7  HCT 44.0  PLT 272    Coag's No results for input(s): APTT, INR in the last 168 hours.  Sepsis Markers  Recent Labs Lab 09-28-15 0338  LATICACIDVEN 7.08*    ABG  Recent Labs Lab 09/28/2015 0402  PHART 7.273*  PCO2ART 44.3  PO2ART 182.0*    Liver Enzymes  Recent Labs Lab 09-28-2015 0317  AST 89*  ALT 44  ALKPHOS 78  BILITOT 0.4  ALBUMIN 3.6    Cardiac Enzymes  Recent Labs Lab 09/28/15 0317  TROPONINI 0.44*    Glucose No results for input(s): GLUCAP in the last 168 hours.  Imaging Dg Chest Portable 1 View  September 28, 2015  CLINICAL DATA:  Endotracheal tube placement. Orogastric tube placement. Code sepsis. Initial encounter. EXAM: PORTABLE CHEST 1 VIEW COMPARISON:  None. FINDINGS: The patient's endotracheal tube is seen ending 3 cm above the carina. An enteric tube is noted extending below the diaphragm. Mild bibasilar opacities may reflect atelectasis or possibly mild pneumonia. No pleural effusion or pneumothorax is seen. The cardiomediastinal silhouette is borderline normal  in size. No acute osseous abnormalities are identified. IMPRESSION: 1. Endotracheal tube seen ending 3 cm above the carina. 2. Enteric tube noted extending below the diaphragm. 3. Mild bibasilar airspace opacities may reflect atelectasis or possibly mild pneumonia. Electronically Signed   By: Roanna Raider M.D.   On: September 28, 2015 03:59     STUDIES:  n/a  CULTURES: Flu pending Blood cx pending Urine pending  ANTIBIOTICS: Vancomycin 2/27 >> Levaquin 2/27 >>  LINES/TUBES: ETT 2/27  Foley 2/27 PIV  DISCUSSION: Mrs. Konecny is an 4F who presented with acute hypoxemic respiratory failure and sepsis secondary to presumed pneumonia (viral vs bacterial). No history of heart failure and low BNP, so doubt acute decompensation from that standpoint. Currently tolerating mechanical ventilation on propofol. Per husband, she would want to remain a full code.   ASSESSMENT / PLAN:  PULMONARY A: Acute hypoxemic respiratory failure Presumed pneumonia (viral vs. Bacterial) P:   Follow cultures / flu swab Vanc and levaquin started  in the ED - continue for now Symptom onset >48h prior, so no Tamiflu Wean ventilator as tolerated SBT when able Consider diuresis when BP better  CARDIOVASCULAR A:  Tachycardia Hx hypertension P:  Fluid resuscitate Monitor  RENAL A:   Acute kidney injury - likely secondary to sepsis and poor oral intake Lactic acidosis P:   Fluid resuscitate Trend creatinine Trend lactate Avoid nephrotoxins  GASTROINTESTINAL A:   No acute issues P:   NPO for now Stress ulcer ppx w/ famotidine  HEMATOLOGIC A:   No acute issues P:  DVT ppx with heparin given renal fxn  INFECTIOUS A:  Sepsis 2/2 presumed pulmonary source P:  Vancomycin and levaquin as initiated in the ED No tamiflu 2/2 onset of symptoms >48h ago Trend lactate, WBC  ENDOCRINE A:   Hyperglycemia Hypothyroidism P:   CBGs and SSI Resume home thyroid supplementation when home meds  known  NEUROLOGIC A:   No acute issues P:   RASS goal: 0 Wean sedation as tolerated   FAMILY  - Updates: Husband at bedside  - Inter-disciplinary family meet or Palliative Care meeting due by:  day 7  The patient is critically ill with multiple organ system failure and requires high complexity decision making for assessment and support, frequent evaluation and titration of therapies, advanced monitoring, review of radiographic studies and interpretation of complex data.   Critical Care Time devoted to patient care services, exclusive of separately billable procedures, described in this note is 55 minutes.   Nita Sickle, MD Pulmonary and Critical Care Medicine [facility] Pager: [phone]  September 30, 2015, 6:00 AM

## 2015-09-07 NOTE — Progress Notes (Signed)
RT called to pt's room for decreased spo2. Pt on 70% fio2 with peep of 5. Increased to 14 and 100% without improvement. Pt taking off vent and manually ventilated x5 mins. MD to bedside. Pt spo2 increased to 96% and was place back on vent. Pt desaturated several more times and was manually ventilated each time until pt lost pulse and code blue was called. Eventually pt regained pulse and was placed back on ventilator. Pt again lost pulse and manually ventilated again.  RT remained at bedside throughout until pt eventually expired and was extubated per MD.

## 2015-09-07 NOTE — Progress Notes (Signed)
   2015-09-11 1400  Clinical Encounter Type  Visited With Family  Visit Type Code;Patient actively dying  Referral From Nurse  Spiritual Encounters  Spiritual Needs Emotional;Prayer  Stress Factors  Family Stress Factors Loss of control;Major life changes;Loss  Chaplain was called to sit with husband of patient who was very seriously ill. Husband did not have diabetes medication and was in conference room eating to regain blood sugar balance. Nurse advised chaplain that husband should not leave because medical situation was critical, also informed chaplain that patient's son had been buried Thursday. Chaplain called patient's church and informed pastor's wife of situation, and she indicated she would come. Chaplain then stayed with husband. Code Blue called for his wife, husband did not want to be there while they worked on her, but doctor instructed admin to bring him to room. Husband then strongly indicated he did not want to see wife again, that he was at peace with this and that she would not have wanted life prolonging measures taken. Patient subsequently died, and doctor informed patient's husband in presence of chaplain and pastor's wife, who had by then arrived. Husband was ready to leave, so pastor's wife said she would meet him at house. Chaplain directed her to exit, then escorted husband to ED, where he had parked his car early this morning. Chaplain called [id number] to alert them that husband had left and to verify name of funeral home.

## 2015-09-07 NOTE — Discharge Summary (Signed)
[facility] Death Note  Date of admission 09/06/15 Date of Death: 2015-09-06  Cause of death  Influenza pneumonia (Primary cause) Acute respiratory failure with hypoxemia Acute myocarditis  History of Present Illness: 82 y/o female with a past medical history significant for hyperlipidemia, headache, acid reflux was admitted to [facility] on September 06, 2015 with acute respiratory failure with hypoxemia. Her husband tells the story because she was intubated upon my assessment. He notes that for approximately 12 hours prior to admission she had the sudden onset of cough with progressive dyspnea. She was brought by EMS for confusion and trouble breathing to the [facility] emergency department.  Past Medical History  Diagnosis Date  . Hyperlipidemia   . Arthritis   . Headache(784.0)   . Thyroid disease   . Anxiety   . Osteopenia   . GERD (gastroesophageal reflux disease)   . Shingles     Hospital course (per my progress note on September 05, 2022): I was called emergently to the bedside to assess [name] who was admitted from the ER earlier this morning. She developed a cough yesterday and came to the [facility] ER with progressive dyspnea and confusion. She was intubated and brought to the ICU. FLU A positive.  I was called to the bedside for progressive hypoxemia and later shock. Her breath sounds were equal bilaterally but coarse with vent supported breaths. Her O2 saturation was 78-85% on 100% FiO2, PEEP 20 on PRVC. We bagged her continuously, ordered a CXR which showed progressive interstitial infiltrates.   I placed a CVL emergently. She developed worsening shock so we started levophed.  She would never really improve in terms of oxygenation despite anything we did for over 90 minutes. Eventually she had a cardiac arrest (PEA) and received 5 minutes of CPR. I performed an emergent bronchoscopy and found that her airways were red, beefy, and bloody  consistent with acute viral bronchitis (influenza).  We added levophed, sodium bicarbonate and epinephrine. Despite this she developed cardiac arrest again. Bedside echo did not show tamponade, LVEF on sub-xyphoid view showed globally depressed LVEF.  We then performed CPR again for 10 minutes. The patients husband stated that she never wanted life support and asked that we stop. Time of death was 1257pm.  [place], MD [facility] Pager: [phone] Cell: [phone] After 3pm or if no response, call [phone]

## 2015-09-07 DEATH — deceased

## 2015-09-10 LAB — CULTURE, BLOOD (ROUTINE X 2)
CULTURE: NO GROWTH
CULTURE: NO GROWTH

## 2017-02-03 IMAGING — CR DG CHEST 1V PORT
1 series · 1 of 1 positions shown · non-contrast
Comparison: None.

CLINICAL DATA: Endotracheal tube placement. Orogastric tube
placement. Code sepsis. Initial encounter.

EXAM:
PORTABLE CHEST 1 VIEW

[AP]
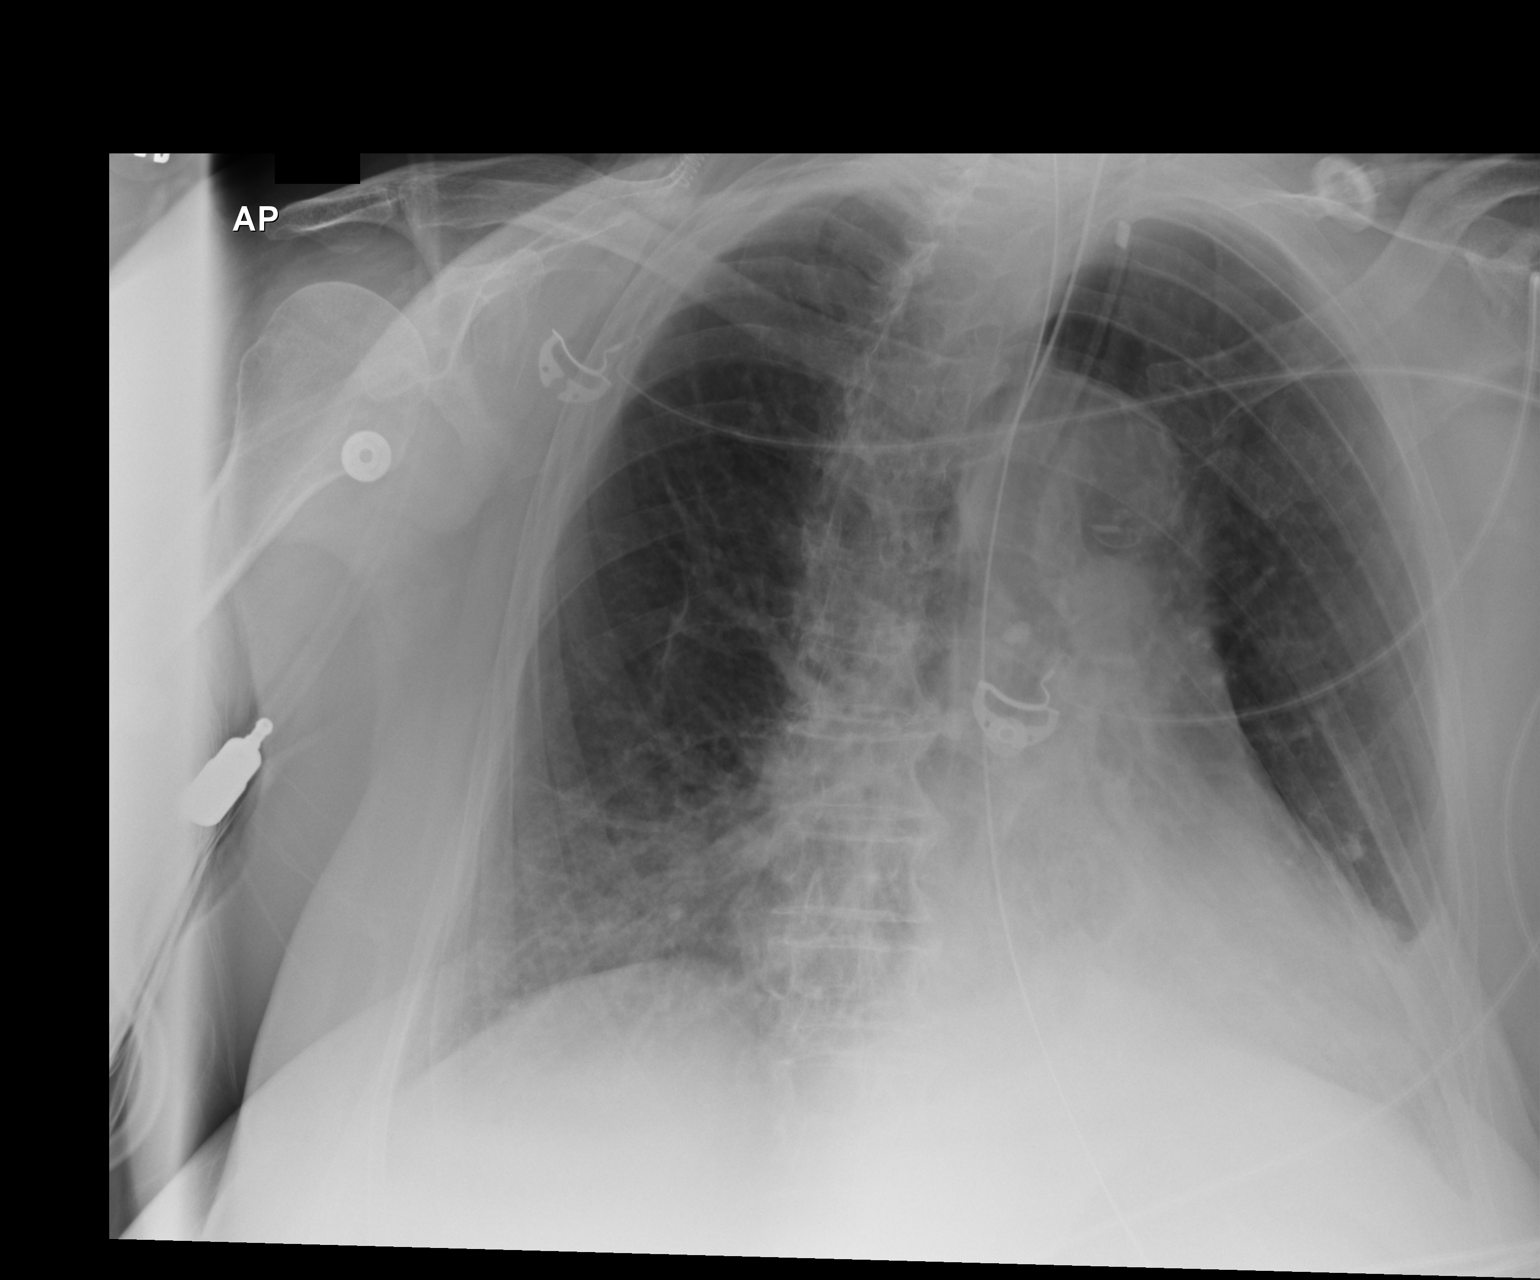

[1 of 1 positions shown; findings below may reference images not displayed]

FINDINGS: The patient's endotracheal tube is seen ending 3 cm above the
carina. An enteric tube is noted extending below the diaphragm.

Mild bibasilar opacities may reflect atelectasis or possibly mild
pneumonia. No pleural effusion or pneumothorax is seen.

The cardiomediastinal silhouette is borderline normal in size. No
acute osseous abnormalities are identified.
IMPRESSION: 1. Endotracheal tube seen ending 3 cm above the carina.
2. Enteric tube noted extending below the diaphragm.
3. Mild bibasilar airspace opacities may reflect atelectasis or
possibly mild pneumonia.

## 2017-02-03 IMAGING — CR DG CHEST 1V PORT
2 series · 2 of 2 positions shown · non-contrast
Comparison: 09/05/2015 at [DATE] a.m.

CLINICAL DATA: Acute respiratory failure with hypoxemia.

EXAM:
PORTABLE CHEST 1 VIEW [DATE] a.m.

[AP (1 of 2)]
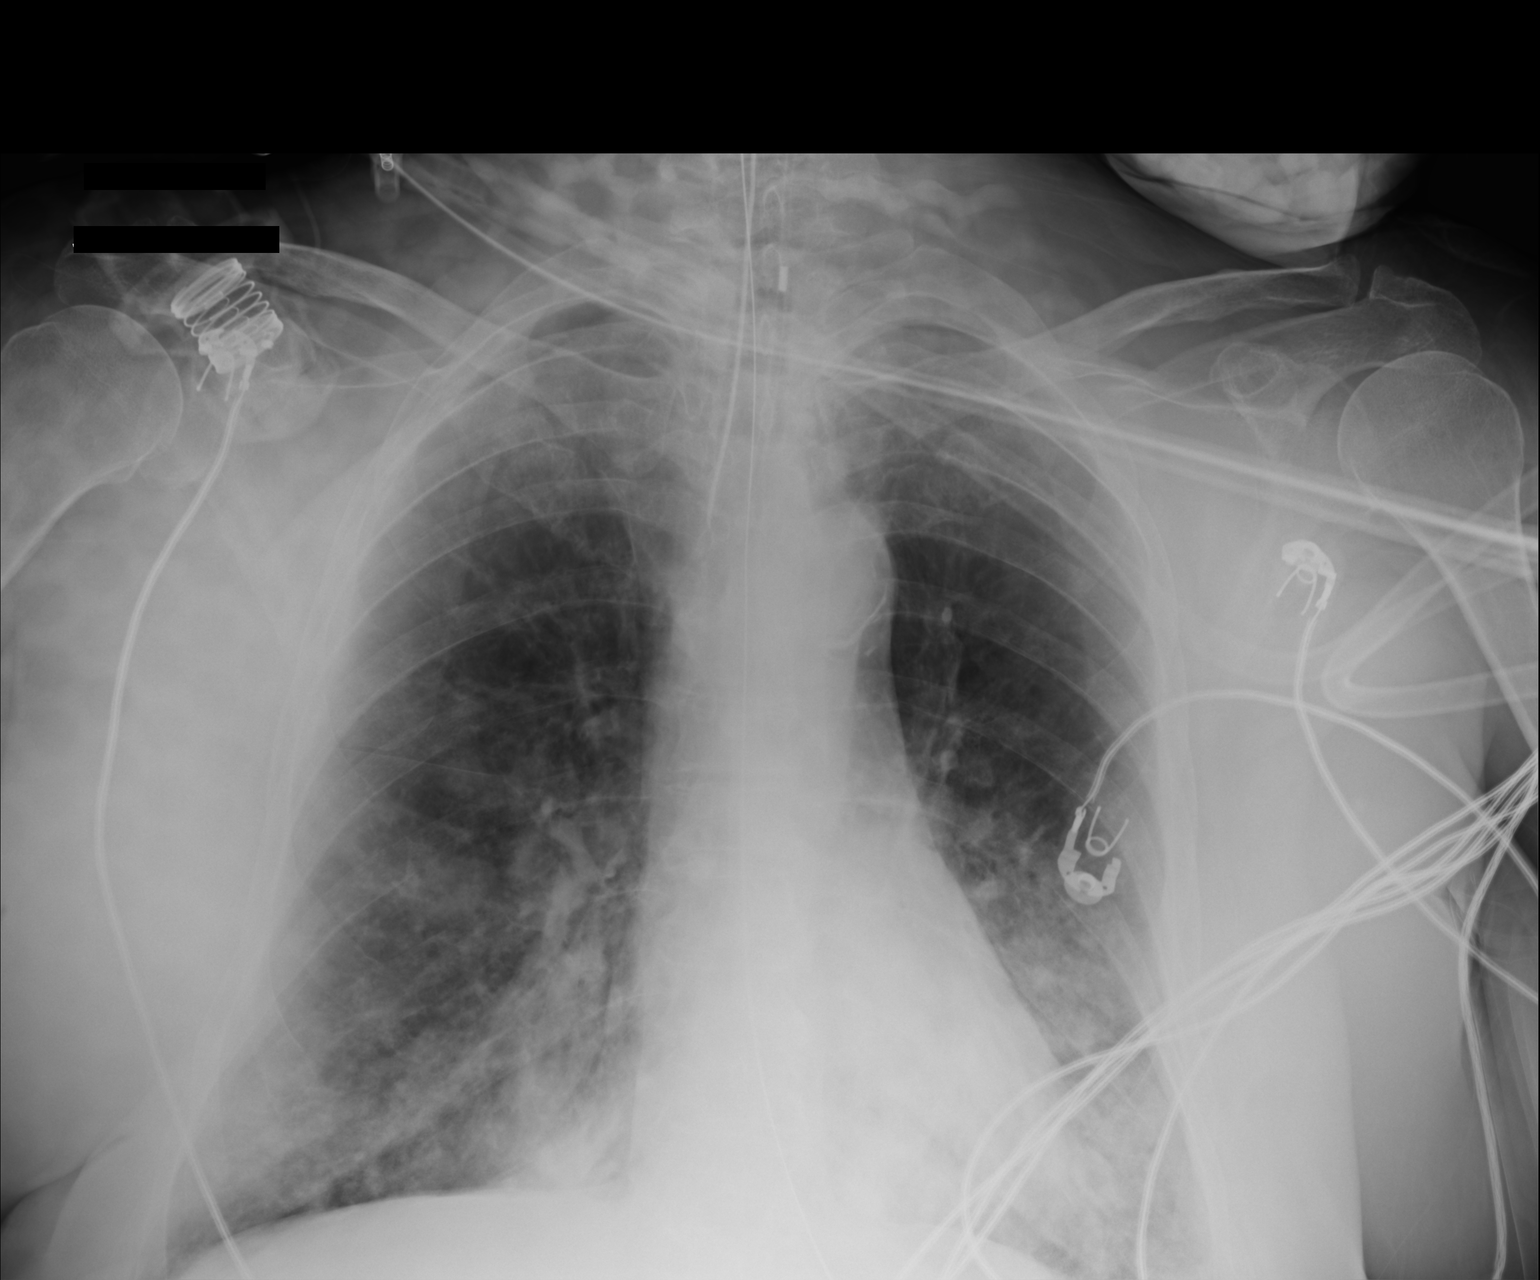

[AP (2 of 2)]
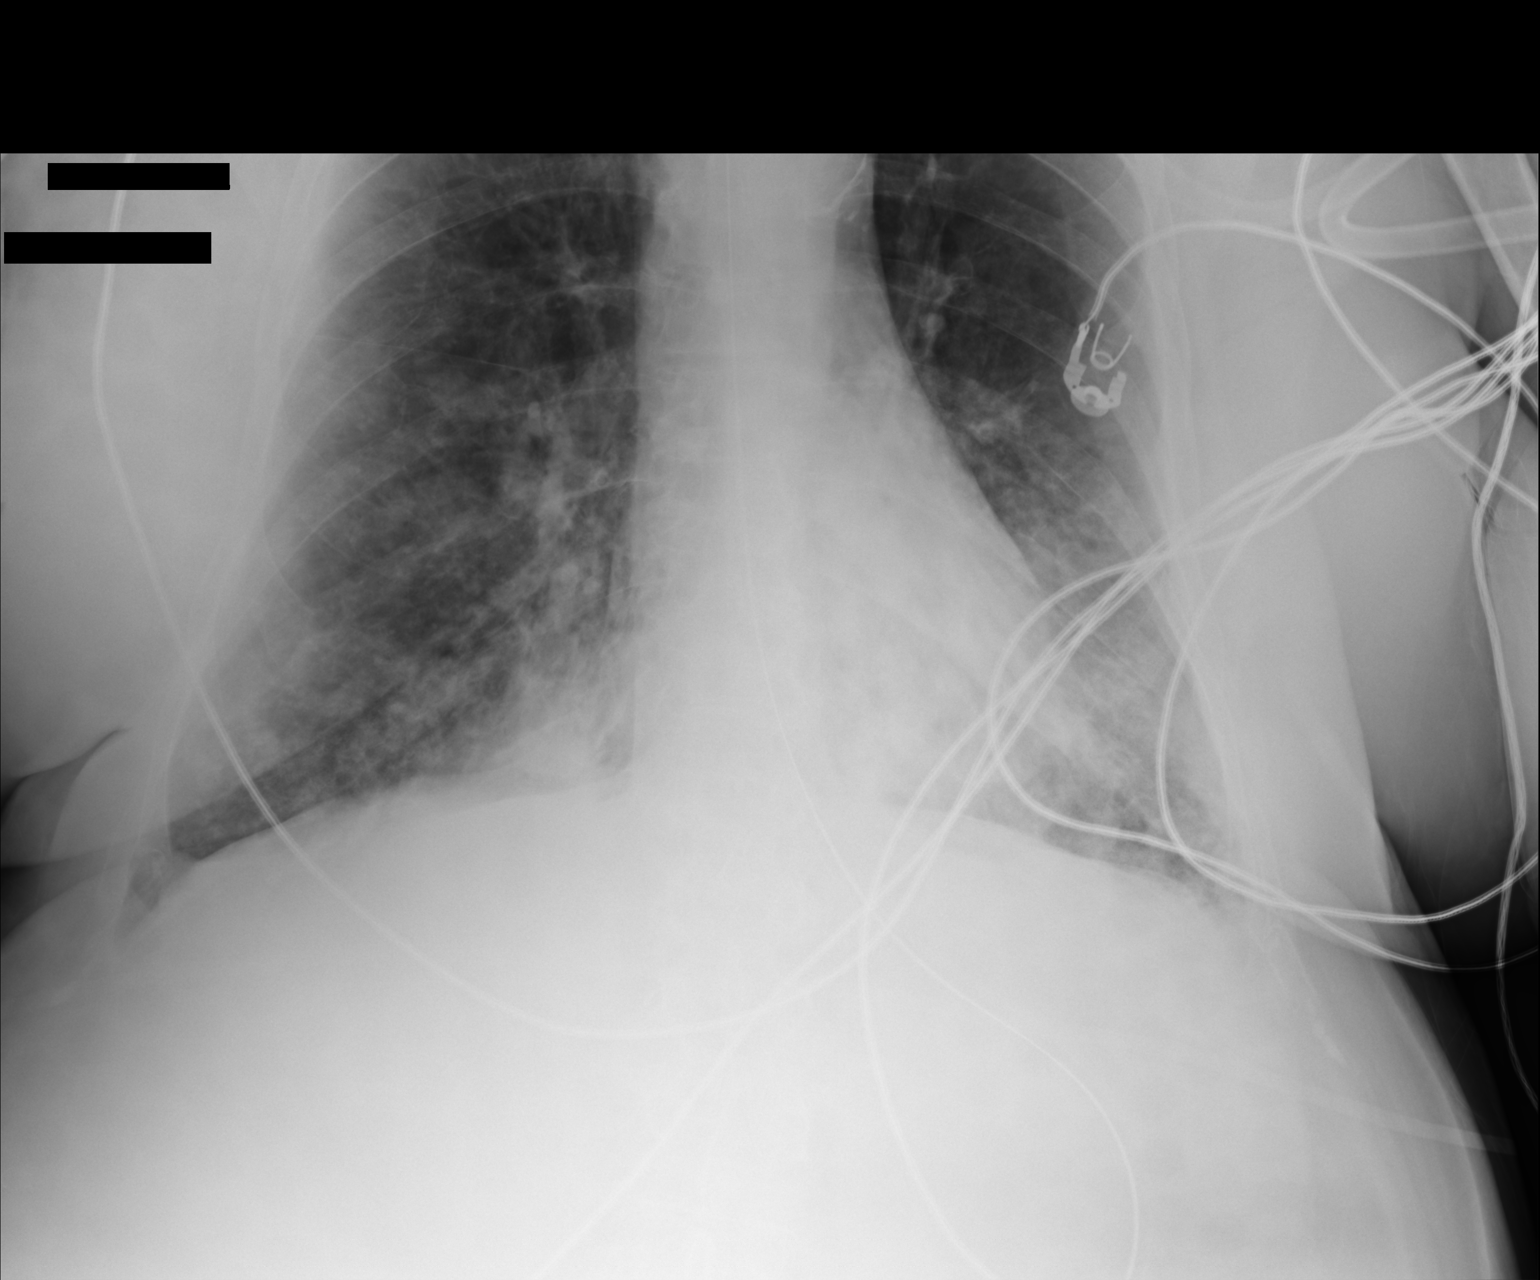

[2 of 2 positions shown; findings below may reference images not displayed]

FINDINGS: The endotracheal tube is in good position. Tip of the NG tube is
below the diaphragm. The bibasilar infiltrates have progressed.
Heart size and pulmonary vascularity remain normal.

No osseous abnormality.
IMPRESSION: Progressive bibasilar pulmonary infiltrates.
